# Patient Record
Sex: Female | Born: 1965 | Race: White | Hispanic: No | Marital: Married | Smoking: Never smoker
Health system: Southern US, Community
[De-identification: ages and names within clinical notes are randomized; demographics above are authoritative.]

## PROBLEM LIST (undated history)

## (undated) DIAGNOSIS — Z87892 Personal history of anaphylaxis: Secondary | ICD-10-CM

## (undated) DIAGNOSIS — M549 Dorsalgia, unspecified: Secondary | ICD-10-CM

## (undated) DIAGNOSIS — N83202 Unspecified ovarian cyst, left side: Secondary | ICD-10-CM

## (undated) DIAGNOSIS — G43909 Migraine, unspecified, not intractable, without status migrainosus: Secondary | ICD-10-CM

## (undated) DIAGNOSIS — G8929 Other chronic pain: Secondary | ICD-10-CM

## (undated) DIAGNOSIS — F32A Depression, unspecified: Secondary | ICD-10-CM

## (undated) DIAGNOSIS — E669 Obesity, unspecified: Secondary | ICD-10-CM

## (undated) DIAGNOSIS — L720 Epidermal cyst: Secondary | ICD-10-CM

## (undated) DIAGNOSIS — F419 Anxiety disorder, unspecified: Secondary | ICD-10-CM

## (undated) DIAGNOSIS — F329 Major depressive disorder, single episode, unspecified: Secondary | ICD-10-CM

## (undated) DIAGNOSIS — J45909 Unspecified asthma, uncomplicated: Secondary | ICD-10-CM

## (undated) HISTORY — DX: Major depressive disorder, single episode, unspecified: F32.9

## (undated) HISTORY — DX: Unspecified asthma, uncomplicated: J45.909

## (undated) HISTORY — DX: Other chronic pain: G89.29

## (undated) HISTORY — DX: Dorsalgia, unspecified: M54.9

## (undated) HISTORY — DX: Migraine, unspecified, not intractable, without status migrainosus: G43.909

## (undated) HISTORY — PX: BACK SURGERY: SHX140

## (undated) HISTORY — DX: Depression, unspecified: F32.A

## (undated) HISTORY — DX: Epidermal cyst: L72.0

## (undated) HISTORY — DX: Obesity, unspecified: E66.9

## (undated) HISTORY — PX: FOOT SURGERY: SHX648

## (undated) HISTORY — DX: Anxiety disorder, unspecified: F41.9

## (undated) HISTORY — DX: Unspecified ovarian cyst, left side: N83.202

## (undated) HISTORY — DX: Personal history of anaphylaxis: Z87.892

---

## 2015-04-29 LAB — HM MAMMOGRAPHY

## 2016-05-14 ENCOUNTER — Ambulatory Visit (INDEPENDENT_AMBULATORY_CARE_PROVIDER_SITE_OTHER): Payer: BLUE CROSS/BLUE SHIELD | Admitting: Physician Assistant

## 2016-05-14 ENCOUNTER — Other Ambulatory Visit: Payer: Self-pay

## 2016-05-14 ENCOUNTER — Encounter: Payer: Self-pay | Admitting: Physician Assistant

## 2016-05-14 VITALS — BP 132/88 | HR 76 | Ht 63.0 in | Wt 236.0 lb

## 2016-05-14 DIAGNOSIS — F331 Major depressive disorder, recurrent, moderate: Secondary | ICD-10-CM | POA: Diagnosis not present

## 2016-05-14 DIAGNOSIS — N921 Excessive and frequent menstruation with irregular cycle: Secondary | ICD-10-CM | POA: Insufficient documentation

## 2016-05-14 DIAGNOSIS — IMO0002 Reserved for concepts with insufficient information to code with codable children: Secondary | ICD-10-CM | POA: Insufficient documentation

## 2016-05-14 DIAGNOSIS — L720 Epidermal cyst: Secondary | ICD-10-CM

## 2016-05-14 DIAGNOSIS — Z0000 Encounter for general adult medical examination without abnormal findings: Secondary | ICD-10-CM | POA: Diagnosis not present

## 2016-05-14 DIAGNOSIS — Z23 Encounter for immunization: Secondary | ICD-10-CM | POA: Diagnosis not present

## 2016-05-14 DIAGNOSIS — G43709 Chronic migraine without aura, not intractable, without status migrainosus: Secondary | ICD-10-CM | POA: Insufficient documentation

## 2016-05-14 DIAGNOSIS — G43109 Migraine with aura, not intractable, without status migrainosus: Secondary | ICD-10-CM

## 2016-05-14 HISTORY — DX: Epidermal cyst: L72.0

## 2016-05-14 MED ORDER — TOPIRAMATE ER 50 MG PO CAP24: 50.0000 mg | ORAL_CAPSULE | Freq: Every day | ORAL | 11 refills | Status: DC

## 2016-05-14 MED ORDER — BUPROPION HCL ER (XL) 150 MG PO TB24: ORAL_TABLET | ORAL | 3 refills | Status: DC

## 2016-05-14 NOTE — Patient Instructions (Addendum)
Go downstairs for labs. We will contact you within 48 hours with your results. Set up MyChart to receive your results even sooner.  Migraines/Depression - stop Topamax - start Trokendi 50mg  nightly - activate the copay card - start Wellbutrin 150mg  every morning - follow-up in 1 month  Cyst on your chest wall: - if you want to have it excised, make an appointment   You are due for colon cancer screening - let us know if you decide to do Cologuard or would like a referral for colonoscopy  I have ordered your annual mammogram - you will be contacted by our imaging department to schedule this [facility] in the [facility]  Physical Activity Recommendations for modifying lipids and lowering blood pressure Engage in aerobic physical activity to reduce LDL-cholesterol, non-HDL-cholesterol, and blood pressure  Frequency: 3-4 sessions per week  Intensity: moderate to vigorous  Duration: 40 minutes on average  Physical Activity Recommendations for secondary prevention 1. Aerobic exercise  Frequency: 3-5 sessions per week  Intensity: 50-80% capacity  Duration: 20 - 60 minutes  Examples: walking, treadmill, cycling, rowing, stair climbing, and arm/leg ergometry  2. Resistance exercise  Frequency: 2-3 sessions per week  Intensity: 10-15 repetitions/set to moderate fatigue  Duration: 1-3 sets of 8-10 upper and lower body exercises  Examples: calisthenics, elastic bands, cuff/hand weights, dumbbels, free weights, wall pulleys, and weight machines  Heart-Healthy Lifestyle  Eating a diet rich in vegetables, fruits and whole grains: also includes low-fat dairy products, poultry, fish, legumes, and nuts; limit intake of sweets, sugar-sweetened beverages and red meats  Getting regular exercise  Maintaining a healthy weight  Not smoking or getting help quitting  Staying on top of your health; for some people, lifestyle changes alone may not be enough to prevent a heart attack or  stroke. In these cases, taking a statin at the right dose will most likely be necessary

## 2016-05-14 NOTE — Progress Notes (Signed)
HPI:                                                                Cathy Oconnell is a 51 y.o. female who presents to [facility]: Primary Care Sports Medicine today to establish care   Current Concerns include right breast lump  Patient reports a firm, nontender lump on her right chest wall/breast area that has been present for approximately 1 year. She thinks it may have increased in size. Denies any drainage or skin changes to the area. She states it was present during her screening mammogram last year and she was told that it was not concerning. Her screening mammo on 04/29/15 was Bi-RADS 2-benign.   Depression: patient states this is a chronic problem for her dating back to childhood and issues with her parents. She states she has been on Wellbutrin in the past and this worked well for her. She endorses occasional suicidal thoughts, but does not have a plan and states she would not act on them. She denies symptoms of mania/hypomania. Denies AH/VH.   Chronic Back Pain: taking narcotic pain medication. Followed by [facility].   Health Maintenance Health Maintenance  Topic Date Due  . HIV Screening  02/27/1981  . COLONOSCOPY  02/28/2016  . INFLUENZA VACCINE  10/25/2018 (Originally 09/24/2015)  . MAMMOGRAM  04/28/2017  . PAP SMEAR  04/01/2018  . TETANUS/TDAP  05/15/2026    GYN/Sexual Health  Menstrual status: perimenopausal  LMP: 12/2015  Menses: irregular, heavy  Last pap smear: 03/2015  History of abnormal pap smears: no  Sexually active: yes, 1 female partner (husband)  Current contraception: none  Health Habits  Diet: fair, 3 cups coffee/tea/soda per day  Exercise: yoga 45 min x 4 days/week  Past Medical History:  Diagnosis Date  . Anxiety   . Asthma   . Chronic back pain   . Depression   . Migraine headache    Past Surgical History:  Procedure Laterality Date  . BACK SURGERY    . CESAREAN SECTION    . FOOT  SURGERY Left    Social History  Substance Use Topics  . Smoking status: Never Smoker  . Smokeless tobacco: Never Used  . Alcohol use 0.6 oz/week    1 Glasses of wine per week   family history includes Breast cancer in her mother.  ROS: negative except as noted in the HPI  Medications: Current Outpatient Prescriptions  Medication Sig Dispense Refill  . buPROPion (WELLBUTRIN XL) 150 MG 24 hr tablet 1 tablet daily for a month then 1 tab twice a day 60 tablet 3  . HYDROmorphone (DILAUDID) 4 MG tablet     . HYDROmorphone HCl (EXALGO) 8 MG T24A SR tablet     . Topiramate ER (TROKENDI XR) 50 MG CP24 Take 50 mg by mouth at bedtime. 30 capsule 11   No current facility-administered medications for this visit.    Allergies  Allergen Reactions  . Aspirin Anaphylaxis  . Albuterol        Objective:  BP 132/88   Pulse 76   Ht 5\' 3"  (1.6 m)   Wt 236 lb (107 kg)   BMI 41.81 kg/m  Gen: well-groomed, obese, cooperative, not ill-appearing, no distress HEENT: normal  conjunctiva, TM's clear, oropharynx clear, moist mucus membranes, no thyromegaly or tenderness Pulm: Normal work of breathing, normal phonation, clear to auscultation bilaterally CV: Normal rate, regular rhythm, s1 and s2 distinct, no murmurs, clicks or rubs, no carotid bruit GI: abdomen soft, nondistended, nontender, no masses Neuro: alert and oriented x 3, EOM's intact, PERRLA, DTR's intact, normal tone, no tremor MSK: moving all extremities, normal gait and station, no peripheral edema Skin: warm and dry; firm approx. 0.5 cm nodule on the right chest wall, borders well-defined, nontender Psych: normal affect, euthymic mood, normal speech and thought content  [facility] 05/14/2016 05/14/2016  Decreased Interest 1 1  Down, Depressed, Hopeless 2 2  PHQ - 2 Score 3 3  Altered sleeping 2 2  Tired, decreased energy 2 2  Change in appetite 1 1  Feeling bad or failure about yourself  2 2  Trouble concentrating  0 0  Moving slowly or fidgety/restless 1 1  Suicidal thoughts 1 1  PHQ-9 Score 12 12      Assessment and Plan: 51 y.o. female with   Encounter for preventative adult health care examination - CBC - Comprehensive metabolic panel - Hemoglobin A1c - Lipid Panel w/reflex Direct LDL - Pap utd - Mammogram ordered - Tdap vaccine given - discussed Cologuard for colon cancer screening. Patient is going to contact her insurance company and let Cathy Oconnell know at her follow-up appt in 4 weeks  Moderate episode of recurrent major depressive disorder (HCC) - patient contracted for safety - restarting wellbutrin - buPROPion (WELLBUTRIN XL) 150 MG 24 hr tablet; 1 tablet daily for a month then 1 tab twice a day  Dispense: 60 tablet; Refill: 3 - follow-up in 4 weeks  Migraine with aura and without status migrainosus, not intractable - switching from Topamax to Trokendi for better migraine prevention - Topiramate ER (TROKENDI XR) 50 MG CP24; Take 50 mg by mouth at bedtime.  Dispense: 30 capsule; Refill: 11  Epidermoid cyst of skin of chest - reassurance provided - patient instructed that if she desires excision she can schedule this in our office. Discussed there is a high probability of recurrence  Patient education and anticipatory guidance given Patient agrees with treatment plan Follow-up in 4 weeks or sooner as needed  Levonne Hubertharley E. Houa Ackert PA-C

## 2016-06-11 ENCOUNTER — Ambulatory Visit (INDEPENDENT_AMBULATORY_CARE_PROVIDER_SITE_OTHER): Payer: BLUE CROSS/BLUE SHIELD | Admitting: Physician Assistant

## 2016-06-11 VITALS — BP 138/81 | HR 80 | Wt 240.0 lb

## 2016-06-11 DIAGNOSIS — F331 Major depressive disorder, recurrent, moderate: Secondary | ICD-10-CM

## 2016-06-11 DIAGNOSIS — G43109 Migraine with aura, not intractable, without status migrainosus: Secondary | ICD-10-CM | POA: Diagnosis not present

## 2016-06-11 MED ORDER — FLUOXETINE HCL 20 MG PO TABS: 20.0000 mg | ORAL_TABLET | Freq: Every day | ORAL | 3 refills | Status: DC

## 2016-06-11 MED ORDER — TOPIRAMATE ER 100 MG PO CAP24: 100.0000 mg | ORAL_CAPSULE | Freq: Every day | ORAL | 11 refills | Status: DC

## 2016-06-11 MED ORDER — RIZATRIPTAN BENZOATE 5 MG PO TBDP: 5.0000 mg | ORAL_TABLET | ORAL | 5 refills | Status: DC | PRN

## 2016-06-11 NOTE — Patient Instructions (Addendum)
- Take Fluoxetine 1/2 tablet daily for a week, then take a full tablet daily - Okay to take Fluoxetine with your Wellbutrin - Switch to Trokendi  nightly - Exercise at least 25 minutes 3-4 days per week   Living With Depression Everyone experiences occasional disappointment, sadness, and loss in their lives. When you are feeling down, blue, or sad for at least 2 weeks in a row, it may mean that you have depression. Depression can affect your thoughts and feelings, relationships, daily activities, and physical health. It is caused by changes in the way your brain functions. If you receive a diagnosis of depression, your health care provider will tell you which type of depression you have and what treatment options are available to you. If you are living with depression, there are ways to help you recover from it and also ways to prevent it from coming back. How to cope with lifestyle changes Coping with stress  Stress is your body's reaction to life changes and events, both good and bad. Stressful situations may include:  Getting married.  The death of a spouse.  Losing a job.  Retiring.  Having a baby. Stress can last just a few hours or it can be ongoing. Stress can play a major role in depression, so it is important to learn both how to cope with stress and how to think about it differently. Talk with your health care provider or a counselor if you would like to learn more about stress reduction. He or she may suggest some stress reduction techniques, such as:  Music therapy. This can include creating music or listening to music. Choose music that you enjoy and that inspires you.  Mindfulness-based meditation. This kind of meditation can be done while sitting or walking. It involves being aware of your normal breaths, rather than trying to control your breathing.  Centering prayer. This is a kind of meditation that involves focusing on a spiritual word or phrase. Choose a word,  phrase, or sacred image that is meaningful to you and that brings you peace.  Deep breathing. To do this, expand your stomach and inhale slowly through your nose. Hold your breath for 3-5 seconds, then exhale slowly, allowing your stomach muscles to relax.  Muscle relaxation. This involves intentionally tensing muscles then relaxing them. Choose a stress reduction technique that fits your lifestyle and personality. Stress reduction techniques take time and practice to develop. Set aside 5-15 minutes a day to do them. Therapists can offer training in these techniques. The training may be covered by some insurance plans. Other things you can do to manage stress include:  Keeping a stress diary. This can help you learn what triggers your stress and ways to control your response.  Understanding what your limits are and saying no to requests or events that lead to a schedule that is too full.  Thinking about how you respond to certain situations. You may not be able to control everything, but you can control how you react.  Adding humor to your life by watching funny films or TV shows.  Making time for activities that help you relax and not feeling guilty about spending your time this way. Medicines  Your health care provider may suggest certain medicines if he or she feels that they will help improve your condition. Avoid using alcohol and other substances that may prevent your medicines from working properly (may interact). It is also important to:  Talk with your pharmacist or health care provider  about all the medicines that you take, their possible side effects, and what medicines are safe to take together.  Make it your goal to take part in all treatment decisions (shared decision-making). This includes giving input on the side effects of medicines. It is best if shared decision-making with your health care provider is part of your total treatment plan. If your health care provider prescribes  a medicine, you may not notice the full benefits of it for 4-8 weeks. Most people who are treated for depression need to be on medicine for at least 6-12 months after they feel better. If you are taking medicines as part of your treatment, do not stop taking medicines without first talking to your health care provider. You may need to have the medicine slowly decreased (tapered) over time to decrease the risk of harmful side effects. Relationships  Your health care provider may suggest family therapy along with individual therapy and drug therapy. While there may not be family problems that are causing you to feel depressed, it is still important to make sure your family learns as much as they can about your mental health. Having your family's support can help make your treatment successful. How to recognize changes in your condition Everyone has a different response to treatment for depression. Recovery from major depression happens when you have not had signs of major depression for two months. This may mean that you will start to:  Have more interest in doing activities.  Feel less hopeless than you did 2 months ago.  Have more energy.  Overeat less often, or have better or improving appetite.  Have better concentration. Your health care provider will work with you to decide the next steps in your recovery. It is also important to recognize when your condition is getting worse. Watch for these signs:  Having fatigue or low energy.  Eating too much or too little.  Sleeping too much or too little.  Feeling restless, agitated, or hopeless.  Having trouble concentrating or making decisions.  Having unexplained physical complaints.  Feeling irritable, angry, or aggressive. Get help as soon as you or your family members notice these symptoms coming back. How to get support and help from others How to talk with friends and family members about your condition  Talking to friends and  family members about your condition can provide you with one way to get support and guidance. Reach out to trusted friends or family members, explain your symptoms to them, and let them know that you are working with a health care provider to treat your depression. Financial resources  Not all insurance plans cover mental health care, so it is important to check with your insurance carrier. If paying for co-pays or counseling services is a problem, search for a local or county mental health care center. They may be able to offer public mental health care services at low or no cost when you are not able to see a private health care provider. If you are taking medicine for depression, you may be able to get the generic form, which may be less expensive. Some makers of prescription medicines also offer help to patients who cannot afford the medicines they need. Follow these instructions at home:  Get the right amount and quality of sleep.  Cut down on using caffeine, tobacco, alcohol, and other potentially harmful substances.  Try to exercise, such as walking or lifting small weights.  Take over-the-counter and prescription medicines only as told by your  health care provider.  Eat a healthy diet that includes plenty of vegetables, fruits, whole grains, low-fat dairy products, and lean protein. Do not eat a lot of foods that are high in solid fats, added sugars, or salt.  Keep all follow-up visits as told by your health care provider. This is important. Contact a health care provider if:  You stop taking your antidepressant medicines, and you have any of these symptoms:  Nausea.  Headache.  Feeling lightheaded.  Chills and body aches.  Not being able to sleep (insomnia).  You or your friends and family think your depression is getting worse. Get help right away if:  You have thoughts of hurting yourself or others. If you ever feel like you may hurt yourself or others, or have thoughts  about taking your own life, get help right away. You can go to your nearest emergency department or call:  Your local emergency services (911 in the [place]  A suicide crisis helpline, such as the National Suicide Prevention Lifeline at 306-121-9415. This is open 24-hours a day. Summary  If you are living with depression, there are ways to help you recover from it and also ways to prevent it from coming back.  Work with your health care team to create a management plan that includes counseling, stress management techniques, and healthy lifestyle habits. This information is not intended to replace advice given to you by your health care provider. Make sure you discuss any questions you have with your health care provider. Document Released: 01/13/2016 Document Revised: 01/13/2016 Document Reviewed: 01/13/2016 Elsevier Interactive Patient Education  2017 [employer].

## 2016-06-11 NOTE — Progress Notes (Signed)
HPI:                                                                Cathy Oconnell is a 51 y.o. female who presents to [name]: Primary Care Sports Medicine today for depression and headache follow-up  Migraines: patient has been taking Trokendi  nightly without issues. Patient reports 5 migraines since last visit on 05/14/16. Previously she was having approximately 8 migraines per month on Topamax, so this does represent an improvement in number of headache days for her.   Depression: taking Wellbutrin without difficulty. She continues to endorse feelings of worthlessness and tearfulness. She has occasional suicidal thoughts, but she does not have a plan and states she would never hurt herself. She has no history of self-harm. Denies symptoms of mania/hypomania. Denies auditory/visual hallucinations.   Past Medical History:  Diagnosis Date  . Anxiety   . Asthma   . Chronic back pain   . Depression   . Migraine headache    Past Surgical History:  Procedure Laterality Date  . BACK SURGERY    . CESAREAN SECTION    . FOOT SURGERY Left    Social History  Substance Use Topics  . Smoking status: Never Smoker  . Smokeless tobacco: Never Used  . Alcohol use 0.6 oz/week    1 Glasses of wine per week   family history includes Breast cancer in her mother.  ROS: negative except as noted in the HPI  Medications: Current Outpatient Prescriptions  Medication Sig Dispense Refill  . buPROPion (WELLBUTRIN XL) 150 MG 24 hr tablet 1 tablet daily for a month then 1 tab twice a day 60 tablet 3  . HYDROmorphone (DILAUDID) 4 MG tablet     . HYDROmorphone HCl (EXALGO) 8 MG T24A SR tablet     . Topiramate ER (TROKENDI XR) 50 MG CP24 Take 50 mg by mouth at bedtime. 30 capsule 11   No current facility-administered medications for this visit.    Allergies  Allergen Reactions  . Aspirin Anaphylaxis  . Albuterol        Objective:  BP 138/81   Pulse 80   Wt  240 lb (108.9 kg)   BMI 42.51 kg/m  Gen: well-groomed, cooperative, not ill-appearing, no distress Pulm: Normal work of breathing, normal phonation Neuro: alert and oriented x 3, EOM's intact, no tremor MSK: moving all extremities, normal gait and station, no peripheral edema Psych: good eye contact, depressed mood, becomes tearful during visit, normal speech and thought content   Depression screen [facility] 2/9 06/11/2016 05/14/2016 05/14/2016  Decreased Interest Down, Depressed, Hopeless PHQ - 2 Score Altered sleeping Tired, decreased energy Change in appetite Feeling bad or failure about yourself  Trouble concentrating 1 0 0  Moving slowly or fidgety/restless Suicidal thoughts PHQ-9 Score Assessment and Plan: 51 y.o. female with   1. Migraine with aura and without status migrainosus, not intractable - increasing Trokendi to  nightly - Topiramate ER (TROKENDI XR) 100 MG CP24; Take 100 mg by  mouth at bedtime.  Dispense: 30 capsule; Refill: 11 - rizatriptan (MAXALT-MLT) 5 MG disintegrating tablet; Take 1 tablet (5 mg total) by mouth as needed for migraine.  Dispense: 10 tablet; Refill: 5  2. Moderate episode of recurrent major depressive disorder (HCC) - PHQ9 increased from 12 to 14 despite a month of Wellbutrin - patient contracted for safety - will leave Wellbutrin at  to avoid risk of increasing headaches - adding Fluoxetine  - encouraged regular exercise - FLUoxetine (PROZAC) 20 MG tablet; Take 1 tablet (20 mg total) by mouth daily.  Dispense: 30 tablet; Refill: 3  Patient education and anticipatory guidance given Patient agrees with treatment plan Follow-up in 4 weeks or sooner as needed if symptoms worsen or fail to improve  Levonne Hubert PA-C

## 2016-06-15 ENCOUNTER — Encounter: Payer: Self-pay | Admitting: Physician Assistant

## 2016-06-16 ENCOUNTER — Ambulatory Visit: Payer: BLUE CROSS/BLUE SHIELD

## 2016-06-22 ENCOUNTER — Ambulatory Visit (INDEPENDENT_AMBULATORY_CARE_PROVIDER_SITE_OTHER): Payer: BLUE CROSS/BLUE SHIELD | Admitting: Physician Assistant

## 2016-06-22 ENCOUNTER — Encounter: Payer: Self-pay | Admitting: Physician Assistant

## 2016-06-22 VITALS — BP 139/93 | HR 85 | Wt 240.0 lb

## 2016-06-22 DIAGNOSIS — L720 Epidermal cyst: Secondary | ICD-10-CM

## 2016-06-22 DIAGNOSIS — L02213 Cutaneous abscess of chest wall: Secondary | ICD-10-CM | POA: Diagnosis not present

## 2016-06-22 MED ORDER — ACETAMINOPHEN 325 MG PO TABS
1000.0000 mg | ORAL_TABLET | Freq: Once | ORAL | Status: AC
  Administered 2016-06-22: 975 mg via ORAL

## 2016-06-22 MED ORDER — DOXYCYCLINE HYCLATE 100 MG PO TABS: 100.0000 mg | ORAL_TABLET | Freq: Two times a day (BID) | ORAL | 0 refills | Status: DC

## 2016-06-22 NOTE — Progress Notes (Signed)
   Subjective:    I'm seeing this patient as a consultation for:  Gena Fray, PA-C  CC: Mass on chest  HPI: For a long time this pleasant 51 year old female has had a occasionally tender mass on her right upper chest. Charley started incision and drainage and got some purulence out, because persistent sebaceous material was seen coming from the wound I was called for further evaluation and definitive treatment. Symptoms are moderate, persistent, localized without radiation.  Past medical history:  Negative.  See flowsheet/record as well for more information.  Surgical history: Negative.  See flowsheet/record as well for more information.  Family history: Negative.  See flowsheet/record as well for more information.  Social history: Negative.  See flowsheet/record as well for more information.  Allergies, and medications have been entered into the medical record, reviewed, and no changes needed.   Review of Systems: No headache, visual changes, nausea, vomiting, diarrhea, constipation, dizziness, abdominal pain, skin rash, fevers, chills, night sweats, weight loss, swollen lymph nodes, body aches, joint swelling, muscle aches, chest pain, shortness of breath, mood changes, visual or auditory hallucinations.   Objective:   General: Well Developed, well nourished, and in no acute distress.  Neuro/Psych: Alert and oriented x3, extra-ocular muscles intact, able to move all 4 extremities, sensation grossly intact. Skin: Warm and dry, no rashes noted. There is a 1 cm incision over the right upper anterior chest, there is a minimal surrounding erythema. Pressure does express some sebaceous material. Respiratory: Not using accessory muscles, speaking in full sentences, trachea midline.  Cardiovascular: Pulses palpable, no extremity edema. Abdomen: Does not appear distended.  Excision of skin mass, 1 cm sebaceous cyst. Risks, benefits, and alternatives explained and consent obtained. Time out  conducted. Surface cleaned with chlorhexidine. Area was already anesthetized with lidocaine and epinephrine Adequate anesthesia ensured. Area prepped and draped in a sterile fashion. I extended the length of the incision with a #11 blade. Then using curved hemostat I explored the 4 quadrants finding the wall of the sebaceous cyst which I was able to remove with additional pressure. Hemostasis achieved. Pt stable. Aftercare and follow-up advised.  Impression and Recommendations:   This case required medical decision making of moderate complexity.  Epidermoid cyst of skin of chest Additional surgical excision as above, antibiotic choice and aftercare per Gena Fray PA-C.

## 2016-06-22 NOTE — Assessment & Plan Note (Signed)
Additional surgical excision as above, antibiotic choice and aftercare per Gena Fray PA-C.

## 2016-06-22 NOTE — Progress Notes (Signed)
HPI:                                                                Cathy Oconnell is a 51 y.o. female who presents to [name]: Primary Care Sports Medicine today for "knot on chest"  Rash  This is a new problem. The current episode started in the past 7 days. The problem has been gradually worsening since onset. The affected locations include the chest. The rash is characterized by redness, swelling and pain. She was exposed to nothing (history of cutaneous cyst). Past treatments include nothing.     Past Medical History:  Diagnosis Date  . Anxiety   . Asthma   . Chronic back pain   . Depression   . Migraine headache   . Obesity    Past Surgical History:  Procedure Laterality Date  . BACK SURGERY    . CESAREAN SECTION    . FOOT SURGERY Left    Social History  Substance Use Topics  . Smoking status: Never Smoker  . Smokeless tobacco: Never Used  . Alcohol use 0.6 oz/week    1 Glasses of wine per week   family history includes Breast cancer in her mother.  ROS: negative except as noted in the HPI  Medications: Current Outpatient Prescriptions  Medication Sig Dispense Refill  . buPROPion (WELLBUTRIN XL) 150 MG 24 hr tablet 1 tablet daily for a month then 1 tab twice a day 60 tablet 3  . doxycycline (VIBRA-TABS) 100 MG tablet Take 1 tablet (100 mg total) by mouth 2 (two) times daily. 14 tablet 0  . FLUoxetine (PROZAC) 20 MG tablet Take 1 tablet (20 mg total) by mouth daily. 30 tablet 3  . HYDROmorphone (DILAUDID) 4 MG tablet     . HYDROmorphone HCl (EXALGO) 8 MG T24A SR tablet     . rizatriptan (MAXALT-MLT) 5 MG disintegrating tablet Take 1 tablet (5 mg total) by mouth as needed for migraine. 10 tablet 5  . Topiramate ER (TROKENDI XR) 100 MG CP24 Take 100 mg by mouth at bedtime. 30 capsule 11   No current facility-administered medications for this visit.    Allergies  Allergen Reactions  . Aspirin Anaphylaxis  . Albuterol         Objective:  BP (!) 139/93   Pulse 85   Wt 240 lb (108.9 kg)   BMI 42.51 kg/m  Gen: well-groomed, cooperative, not ill-appearing, no distress Pulm: Normal work of breathing, normal phonation Neuro: alert and oriented x 3, no tremor MSK: moving all extremities, normal gait and station, no peripheral edema Psych: good eye contact, normal affect, euthymic mood, normal speech and thought content Skin: warm and dry, approx 1.5 cm fluctuant abscess on the right chest wall in the third intercostal area   No results found for this or any previous visit (from the past 72 hour(s)). No results found.    Assessment and Plan: 51 y.o. female with   1. Epidermoid cyst of skin of chest - consulted Dr. Benjamin Stain (see A&P note)  2. Cutaneous abscess of chest wall Procedure:  Incision and drainage of abscess. Risks, benefits, and alternatives explained and consent obtained. Time out conducted. Surface cleaned with chlorhexidine 3 cc lidocaine with epinephine infiltrated around abscess.  Adequate anesthesia ensured. Area prepped and draped in a sterile fashion. #11 blade used to make a stab incision into abscess. Pus expressed with pressure. Curved hemostat used to explore 4 quadrants and loculations broken up. Further purulence expressed. Hemostasis achieved. Patient tolerated procedure without immediate complications. - doxycycline (VIBRA-TABS) 100 MG tablet; Take 1 tablet (100 mg total) by mouth 2 (two) times daily.  Dispense: 14 tablet; Refill: 0   Patient education and anticipatory guidance given Patient agrees with treatment plan Follow-up in 1 week or sooner as needed if symptoms worsen or fail to improve  Levonne Hubert PA-C

## 2016-06-22 NOTE — Patient Instructions (Addendum)
- Take antibiotic twice a day for 7 days - Keep area covered with dressing for 24-48 areas - Keep area clean and dry    Skin Abscess A skin abscess is an infected area on or under your skin that contains a collection of pus and other material. An abscess may also be called a furuncle, carbuncle, or boil. An abscess can occur in or on almost any part of your body. Some abscesses break open (rupture) on their own. Most continue to get worse unless they are treated. The infection can spread deeper into the body and eventually into your blood, which can make you feel ill. Treatment usually involves draining the abscess. What are the causes? An abscess occurs when germs, often bacteria, pass through your skin and cause an infection. This may be caused by:  A scrape or cut on your skin.  A puncture wound through your skin, including a needle injection.  Blocked oil or sweat glands.  Blocked and infected hair follicles.  A cyst that forms beneath your skin (sebaceous cyst) and becomes infected. What increases the risk? This condition is more likely to develop in people who:  Have a weak body defense system (immune system).  Have diabetes.  Have dry and irritated skin.  Get frequent injections or use illegal IV drugs.  Have a foreign body in a wound, such as a splinter.  Have problems with their lymph system or veins. What are the signs or symptoms? An abscess may start as a painful, firm bump under the skin. Over time, the abscess may get larger or become softer. Pus may appear at the top of the abscess, causing pressure and pain. It may eventually break through the skin and drain. Other symptoms include:  Redness.  Warmth.  Swelling.  Tenderness.  A sore on the skin. How is this diagnosed? This condition is diagnosed based on your medical history and a physical exam. A sample of pus may be taken from the abscess to find out what is causing the infection and what antibiotics  can be used to treat it. You also may have:  Blood tests to look for signs of infection or spread of an infection to your blood.  Imaging studies such as ultrasound, CT scan, or MRI if the abscess is deep. How is this treated? Small abscesses that drain on their own may not need treatment. Treatment for an abscess that does not rupture on its own may include:  Warm compresses applied to the area several times per day.  Incision and drainage. Your health care provider will make an incision to open the abscess and will remove pus and any foreign body or dead tissue. The incision area may be packed with gauze to keep it open for a few days while it heals.  Antibiotic medicines to treat infection. For a severe abscess, you may first get antibiotics through an IV and then change to oral antibiotics. Follow these instructions at home: Abscess Care   If you have an abscess that has not drained, place a warm, clean, wet washcloth over the abscess several times a day. Do this as told by your health care provider.  Follow instructions from your health care provider about how to take care of your abscess. Make sure you:  Cover the abscess with a bandage (dressing).  Change your dressing or gauze as told by your health care provider.  Wash your hands with soap and water before you change the dressing or gauze. If soap  and water are not available, use hand sanitizer.  Check your abscess every day for signs of a worsening infection. Check for:  More redness, swelling, or pain.  More fluid or blood.  Warmth.  More pus or a bad smell. Medicines   Take over-the-counter and prescription medicines only as told by your health care provider.  If you were prescribed an antibiotic medicine, take it as told by your health care provider. Do not stop taking the antibiotic even if you start to feel better. General instructions   To avoid spreading the infection:  Do not share personal care items,  towels, or hot tubs with others.  Avoid making skin contact with other people.  Keep all follow-up visits as told by your health care provider. This is important. Contact a health care provider if:  You have more redness, swelling, or pain around your abscess.  You have more fluid or blood coming from your abscess.  Your abscess feels warm to the touch.  You have more pus or a bad smell coming from your abscess.  You have a fever.  You have muscle aches.  You have chills or a general ill feeling. Get help right away if:  You have severe pain.  You see red streaks on your skin spreading away from the abscess. This information is not intended to replace advice given to you by your health care provider. Make sure you discuss any questions you have with your health care provider. Document Released: 11/19/2004 Document Revised: 10/06/2015 Document Reviewed: 12/19/2014 Elsevier Interactive Patient Education  2017 [employer].

## 2016-06-29 ENCOUNTER — Ambulatory Visit (INDEPENDENT_AMBULATORY_CARE_PROVIDER_SITE_OTHER): Payer: BLUE CROSS/BLUE SHIELD | Admitting: Physician Assistant

## 2016-06-29 VITALS — BP 136/83 | HR 79 | Wt 240.0 lb

## 2016-06-29 DIAGNOSIS — L02213 Cutaneous abscess of chest wall: Secondary | ICD-10-CM | POA: Diagnosis not present

## 2016-06-29 NOTE — Progress Notes (Signed)
HPI:                                                                Cathy Oconnell is a 51 y.o. female who [facility]: Primary Care Sports Medicine today for follow-up of a chest wall abscess  Patient reports no issues since incision and drainage of right-sided chest wall abscess 2/2 infected epidermoid cyst on 06/22/16. Cyst was also excised by Dr. Benjamin Stainhekkekandam at that time. She has been taking Doxycyline without issue. She denies any pain, swelling, drainage or warmth at the incision site. She denies any fevers or chills.  Past Medical History:  Diagnosis Date  . Anxiety   . Asthma   . Chronic back pain   . Depression   . Epidermoid cyst of skin of chest 05/14/2016  . Migraine headache   . Obesity    Past Surgical History:  Procedure Laterality Date  . BACK SURGERY    . CESAREAN SECTION    . FOOT SURGERY Left    Social History  Substance Use Topics  . Smoking status: Never Smoker  . Smokeless tobacco: Never Used  . Alcohol use 0.6 oz/week    1 Glasses of wine per week   family history includes Breast cancer in her mother.  ROS: negative except as noted in the HPI  Medications: Current Outpatient Prescriptions  Medication Sig Dispense Refill  . albuterol (PROVENTIL HFA;VENTOLIN HFA) 108 (90 Base) MCG/ACT inhaler Inhale into the lungs every 6 (six) hours as needed.    Marland Kitchen. buPROPion (WELLBUTRIN XL) 150 MG 24 hr tablet 1 tablet daily for a month then 1 tab twice a day 60 tablet 3  . FLUoxetine (PROZAC) 20 MG tablet Take 1 tablet (20 mg total) by mouth daily. 30 tablet 3  . HYDROmorphone (DILAUDID) 4 MG tablet     . HYDROmorphone HCl (EXALGO) 8 MG T24A SR tablet     . rizatriptan (MAXALT-MLT) 5 MG disintegrating tablet Take 1 tablet (5 mg total) by mouth as needed for migraine. 10 tablet 5  . Topiramate ER (TROKENDI XR) 100 MG CP24 Take 100 mg by mouth at bedtime. 30 capsule 11   No current facility-administered medications for this visit.     Allergies  Allergen Reactions  . Aspirin Anaphylaxis       Objective:  BP 136/83   Pulse 79   Wt 240 lb (108.9 kg)   BMI 42.51 kg/m  Gen: well-groomed, cooperative, not ill-appearing, no distress Pulm: Normal work of breathing, normal phonation Neuro: alert and oriented x 3,  MSK: moving all extremities, normal gait and station, no peripheral edema Skin: right chest wall with approx. 1.0 cm healing surgical incision, there is approx. 2.0 cm of surrounding non-tender induration and erythema, no warmth, fluctuance, edema or drainage   No results found for this or any previous visit (from the past 72 hour(s)). No results found.    Assessment and Plan: 51 y.o. female with   1. Cutaneous abscess of chest wall, improving - complete Doxycycline - return if worsening redness, swelling or pain  Patient education and anticipatory guidance given Patient agrees with treatment plan Follow-up as needed if symptoms worsen or fail to improve  Levonne Hubertharley E. Cummings PA-C

## 2016-06-30 ENCOUNTER — Encounter: Payer: Self-pay | Admitting: Physician Assistant

## 2016-07-09 ENCOUNTER — Ambulatory Visit (INDEPENDENT_AMBULATORY_CARE_PROVIDER_SITE_OTHER): Payer: BLUE CROSS/BLUE SHIELD | Admitting: Physician Assistant

## 2016-07-09 VITALS — BP 95/63 | HR 84 | Wt 241.0 lb

## 2016-07-09 DIAGNOSIS — F5105 Insomnia due to other mental disorder: Secondary | ICD-10-CM

## 2016-07-09 DIAGNOSIS — F331 Major depressive disorder, recurrent, moderate: Secondary | ICD-10-CM | POA: Diagnosis not present

## 2016-07-09 DIAGNOSIS — F99 Mental disorder, not otherwise specified: Secondary | ICD-10-CM

## 2016-07-09 MED ORDER — TRAZODONE HCL 50 MG PO TABS: 25.0000 mg | ORAL_TABLET | Freq: Every evening | ORAL | 3 refills | Status: DC | PRN

## 2016-07-09 NOTE — Progress Notes (Signed)
HPI:                                                                Cathy Oconnell is a 51 y.o. female who [facility] [facility] today for depression follow-up  Depression/Anxiety: taking Fluoxetine and Wellbutrin without difficulty. Patient reports she feels she is doing better since the addition of Fluoxetine. Denies symptoms of mania/hypomania. Denies suicidal thinking. Denies auditory/visual hallucinations.  She does endorse sleep disturbance. She reports she is unable to fall asleep until 2-3am and usually wakes up around 6am for work each day, so she as averaging 3-4 hours of sleep per night. She usually is on her laptop of phone at night until she falls asleep. She has tried Melatonin in the past.    Past Medical History:  Diagnosis Date  . Anxiety   . Asthma   . Chronic back pain   . Depression   . Epidermoid cyst of skin of chest 05/14/2016  . Migraine headache   . Obesity    Past Surgical History:  Procedure Laterality Date  . BACK SURGERY    . CESAREAN SECTION    . FOOT SURGERY Left    Social History  Substance Use Topics  . Smoking status: Never Smoker  . Smokeless tobacco: Never Used  . Alcohol use 0.6 oz/week    1 Glasses of wine per week   family history includes Breast cancer in her mother.  ROS: negative except as noted in the HPI  Medications: Current Outpatient Prescriptions  Medication Sig Dispense Refill  . albuterol (PROVENTIL HFA;VENTOLIN HFA) 108 (90 Base) MCG/ACT inhaler Inhale into the lungs every 6 (six) hours as needed.    Marland Kitchen. buPROPion (WELLBUTRIN XL) 150 MG 24 hr tablet 1 tablet daily for a month then 1 tab twice a day 60 tablet 3  . FLUoxetine (PROZAC) 20 MG tablet Take 1 tablet (20 mg total) by mouth daily. 30 tablet 3  . HYDROmorphone (DILAUDID) 4 MG tablet     . HYDROmorphone HCl (EXALGO) 8 MG T24A SR tablet     . rizatriptan (MAXALT-MLT) 5 MG disintegrating tablet Take 1 tablet (5 mg  total) by mouth as needed for migraine. 10 tablet 5  . Topiramate ER (TROKENDI XR) 100 MG CP24 Take 100 mg by mouth at bedtime. 30 capsule 11  . traZODone (DESYREL) 50 MG tablet Take 0.5-1 tablets (25-50 mg total) by mouth at bedtime as needed for sleep. 30 tablet 3   No current facility-administered medications for this visit.    Allergies  Allergen Reactions  . Aspirin Anaphylaxis       Objective:  BP 95/63 (BP Location: Left Wrist, Cuff Size: Normal)   Pulse 84   Wt 241 lb (109.3 kg)   BMI 42.69 kg/m  Gen: well-groomed, morbidly, obese, cooperative, not ill-appearing, no distress Pulm: Normal work of breathing, normal phonation  Neuro: alert and oriented x 3, EOM's intact, no tremor MSK: moving all extremities, normal gait and station, no peripheral edema Psych: good eye contact, normal affect, euthymic mood, normal speech and thought content   Depression screen [facility]/9 07/09/2016 06/11/2016 05/14/2016 05/14/2016  Decreased Interest 0 2 1 1   Down, Depressed, Hopeless 1 2 2 2   PHQ -  2 Score 1 4 3 3   Altered sleeping 3 2 2 2   Tired, decreased energy 1 2 2 2   Change in appetite 1 1 1 1   Feeling bad or failure about yourself  1 2 2 2   Trouble concentrating 0 1 0 0  Moving slowly or fidgety/restless 0 1 1 1   Suicidal thoughts 0 1 1 1   PHQ-9 Score 7 14 12 12    GAD 7 : Generalized Anxiety Score 07/09/2016  Nervous, Anxious, on Edge 2  Control/stop worrying 1  Worry too much - different things 1  Trouble relaxing 2  Restless 1  Easily annoyed or irritable 1  Afraid - awful might happen 2  Total GAD 7 Score 10     No results found for this or any previous visit (from the past 72 hour(s)). No results found.    Assessment and Plan: 51 y.o. female with   1. Insomnia due to other mental disorder - starting Trazodone 25-50mg  as needed at bedtime - encouraged good sleep hygiene, to include no screens at bedtime - traZODone (DESYREL) 50 MG tablet; Take 0.5-1 tablets  (25-50 mg total) by mouth at bedtime as needed for sleep.  Dispense: 30 tablet; Refill: 3  2. Moderate episode of recurrent major depressive disorder (HCC) - PHQ9 score 7, significantly improved from 14, 4 weeks ago - will cont Fluoxetine 20mg  and Wellbutrin 150mg  - follow-up in 3 months   Patient education and anticipatory guidance given Patient agrees with treatment plan Follow-up in 3 months or sooner as needed if symptoms worsen or fail to improve  Levonne Hubert PA-C

## 2016-07-09 NOTE — Patient Instructions (Addendum)
-   Take Trazodone 1/2 - 1 tablet at bedtime as needed for sleep  Sleep Hygiene . Limiting daytime naps to 30 minutes . Napping does not make up for inadequate nighttime sleep. However, a short nap of 20-30 minutes can help to improve mood, alertness and performance.  . Avoiding stimulants such as  caffeine and nicotine after 2pm.  And when it comes to alcohol, moderation is key 4. While alcohol is well-known to help you fall asleep faster, too much close to bedtime can disrupt sleep in the second half of the night as the body begins to process the alcohol.    . Exercising to promote good quality sleep.  As little as 10 minutes of aerobic exercise, such as walking or cycling, can drastically improve nighttime sleep quality.  For the best night's sleep, most people should avoid strenuous workouts close to bedtime. However, the effect of intense nighttime exercise on sleep differs from person to person, so find out what works best for you.   . Steering clear of food that can be disruptive right before sleep.   Heavy or rich foods, fatty or fried meals, spicy dishes, citrus fruits, and carbonated drinks can trigger indigestion for some people. When this occurs close to bedtime, it can lead to painful heartburn that disrupts sleep. . Ensuring adequate exposure to natural light.  This is particularly important for individuals who may not venture outside frequently. Exposure to sunlight during the day, as well as darkness at night, helps to maintain a healthy sleep-wake cycle . Marland Kitchen. Establishing a regular relaxing bedtime routine.  A regular nightly routine helps the body recognize that it is bedtime. This could include taking warm shower or bath, reading a book, or light stretches. When possible, try to avoid emotionally upsetting conversations and activities before attempting to sleep. . Making sure that the sleep environment is pleasant.  Mattress and pillows should be comfortable. The bedroom should be cool -  between 60 and 67 degrees - for optimal sleep. Bright light from lamps, cell phone and TV screens can make it difficult to fall asleep4, so turn those light off or adjust them when possible. Consider using blackout curtains, eye shades, ear plugs, "white noise" machines, humidifiers, fans and other devices that can make the bedroom more relaxing.

## 2016-07-10 ENCOUNTER — Other Ambulatory Visit: Payer: Self-pay

## 2016-07-10 DIAGNOSIS — F331 Major depressive disorder, recurrent, moderate: Secondary | ICD-10-CM

## 2016-07-10 MED ORDER — BUPROPION HCL ER (XL) 150 MG PO TB24: 300.0000 mg | ORAL_TABLET | Freq: Every day | ORAL | 0 refills | Status: DC

## 2016-07-13 ENCOUNTER — Encounter: Payer: Self-pay | Admitting: Physician Assistant

## 2016-07-13 DIAGNOSIS — Z888 Allergy status to other drugs, medicaments and biological substances status: Secondary | ICD-10-CM

## 2016-07-13 DIAGNOSIS — L299 Pruritus, unspecified: Secondary | ICD-10-CM

## 2016-07-13 DIAGNOSIS — Z886 Allergy status to analgesic agent status: Secondary | ICD-10-CM

## 2016-07-14 DIAGNOSIS — Z886 Allergy status to analgesic agent status: Secondary | ICD-10-CM | POA: Insufficient documentation

## 2016-07-14 DIAGNOSIS — Z888 Allergy status to other drugs, medicaments and biological substances status: Secondary | ICD-10-CM | POA: Insufficient documentation

## 2016-07-14 MED ORDER — EPINEPHRINE (ANAPHYLAXIS) 1 MG/ML IJ SOLN: 0.3000 mg | Freq: Once | INTRAMUSCULAR | 1 refills | Status: DC | PRN

## 2016-07-14 MED ORDER — HYDROXYZINE HCL 25 MG PO TABS: 25.0000 mg | ORAL_TABLET | Freq: Three times a day (TID) | ORAL | 3 refills | Status: DC | PRN

## 2016-07-17 ENCOUNTER — Ambulatory Visit (INDEPENDENT_AMBULATORY_CARE_PROVIDER_SITE_OTHER): Payer: BLUE CROSS/BLUE SHIELD | Admitting: Physician Assistant

## 2016-07-17 ENCOUNTER — Encounter: Payer: Self-pay | Admitting: Physician Assistant

## 2016-07-17 VITALS — BP 137/84 | HR 90 | Temp 97.5°F | Wt 240.0 lb

## 2016-07-17 DIAGNOSIS — L720 Epidermal cyst: Secondary | ICD-10-CM

## 2016-07-17 MED ORDER — DOXYCYCLINE HYCLATE 100 MG PO TABS: 100.0000 mg | ORAL_TABLET | Freq: Two times a day (BID) | ORAL | 0 refills | Status: AC

## 2016-07-17 NOTE — Patient Instructions (Addendum)
-   Take Doxy twice a day for 1 week - Wear sunscreen and sun protective clothing - Follow-up in 1 week with me - Plan to have fistula surgery with Dr. Karie Schwalbe in 1-2 weeks once any potential infection is resolved

## 2016-07-17 NOTE — Progress Notes (Signed)
HPI:                                                                Cathy Oconnell is a 51 y.o. female who presents to [name]: Primary Care Sports Medicine today for follow-up of cyst  Patient with history of epidermoid cyst of chest wall that was excised on 06/22/16 due to abscess presents today complaining of redness and tenderness at the incision site. Incision site was left open at that time due to infection and has been healing well. Patient reports last night there was spontaneous drainage of serosanguinous fluid. She denies fever, chills, or purulent drainage.   Past Medical History:  Diagnosis Date  . Anxiety   . Asthma   . Chronic back pain   . Depression   . Epidermoid cyst of skin of chest 05/14/2016  . Migraine headache   . Obesity    Past Surgical History:  Procedure Laterality Date  . BACK SURGERY    . CESAREAN SECTION    . FOOT SURGERY Left    Social History  Substance Use Topics  . Smoking status: Never Smoker  . Smokeless tobacco: Never Used  . Alcohol use 0.6 oz/week    1 Glasses of wine per week   family history includes Breast cancer in her mother.  ROS: negative except as noted in the HPI  Medications: Current Outpatient Prescriptions  Medication Sig Dispense Refill  . albuterol (PROVENTIL HFA;VENTOLIN HFA) 108 (90 Base) MCG/ACT inhaler Inhale into the lungs every 6 (six) hours as needed.    Marland Kitchen buPROPion (WELLBUTRIN XL) 150 MG 24 hr tablet Take 2 tablets (300 mg total) by mouth daily. 180 tablet 0  . EPINEPHrine, Anaphylaxis, 1 MG/ML SOLN Inject 0.3 mg as directed once as needed. 1 vial 1  . FLUoxetine (PROZAC) 20 MG tablet Take 1 tablet (20 mg total) by mouth daily. 30 tablet 3  . HYDROmorphone (DILAUDID) 4 MG tablet     . HYDROmorphone HCl (EXALGO) 8 MG T24A SR tablet     . hydrOXYzine (ATARAX/VISTARIL) 25 MG tablet Take 1 tablet (25 mg total) by mouth 3 (three) times daily as needed for itching. 30 tablet 3  . rizatriptan  (MAXALT-MLT) 5 MG disintegrating tablet Take 1 tablet (5 mg total) by mouth as needed for migraine. 10 tablet 5  . Topiramate ER (TROKENDI XR) 100 MG CP24 Take 100 mg by mouth at bedtime. 30 capsule 11  . traZODone (DESYREL) 50 MG tablet Take 0.5-1 tablets (25-50 mg total) by mouth at bedtime as needed for sleep. 30 tablet 3   No current facility-administered medications for this visit.    Allergies  Allergen Reactions  . Aspirin Anaphylaxis       Objective:  BP 137/84   Pulse 90   Temp 97.5 F (36.4 C) (Oral)   Wt 240 lb (108.9 kg)   BMI 42.51 kg/m  Gen: well-groomed, cooperative, not ill-appearing, no distress Pulm: Normal work of breathing, normal phonation  Neuro: alert and oriented x 3, EOM's intact MSK: moving all extremities, normal gait and station, no peripheral edema Skin: 1cm area of tender induration and erythema on the right chest wall that expresses serosanguinous fluid, no fluctuance or purulence   No results found for  this or any previous visit (from the past 72 hour(s)). No results found.    Assessment and Plan: 51 y.o. female with   1. Epidermoid cyst of skin of chest - no signs of abscess today, but site is inflamed and irritated - consulted Dr. Benjamin Stainhekkekandam who performed the excision, who felt there to be a fistula at the excision site - plan to cover for any possible infection and then schedule fistula repair with Dr. Karie Schwalbe in 1-2 weeks - doxycycline (VIBRA-TABS) 100 MG tablet; Take 1 tablet (100 mg total) by mouth 2 (two) times daily.  Dispense: 14 tablet; Refill: 0  Patient education and anticipatory guidance given Patient agrees with treatment plan Follow-up in 1 week as needed if symptoms worsen or fail to improve  Levonne Hubertharley E. Bartosz Luginbill PA-C

## 2016-07-24 ENCOUNTER — Encounter: Payer: Self-pay | Admitting: Sports Medicine

## 2016-07-24 ENCOUNTER — Ambulatory Visit (INDEPENDENT_AMBULATORY_CARE_PROVIDER_SITE_OTHER): Payer: BLUE CROSS/BLUE SHIELD | Admitting: Physician Assistant

## 2016-07-24 ENCOUNTER — Encounter: Payer: Self-pay | Admitting: Physician Assistant

## 2016-07-24 ENCOUNTER — Ambulatory Visit (INDEPENDENT_AMBULATORY_CARE_PROVIDER_SITE_OTHER): Payer: BLUE CROSS/BLUE SHIELD | Admitting: Sports Medicine

## 2016-07-24 VITALS — BP 125/84 | HR 71 | Wt 237.0 lb

## 2016-07-24 DIAGNOSIS — L720 Epidermal cyst: Secondary | ICD-10-CM | POA: Diagnosis not present

## 2016-07-24 MED ORDER — OXYCODONE-ACETAMINOPHEN 5-325 MG PO TABS: 1.0000 | ORAL_TABLET | Freq: Three times a day (TID) | ORAL | 0 refills | Status: DC | PRN

## 2016-07-24 NOTE — Assessment & Plan Note (Signed)
Excision, sutures placed.  Return in one week for suture removal and wound check.  Oxycodone was written in error, she does have the pain provider that gives her Dilaudid. I discarded the oxycodone prescription.

## 2016-07-24 NOTE — Patient Instructions (Signed)
Please return at 2:30 today for excision of cyst with Dr. Karie Schwalbe

## 2016-07-24 NOTE — Progress Notes (Signed)
HPI:                                                                Cathy Oconnell is a 51 y.o. female who presents to [name]: Primary Care Sports Medicine today for cyst follow-up  Patient with history of epidermoid cyst of chest wall that was excised on 06/22/16 due to abscess presents today for follow-up. Patient was seen on 07/17/16 complaining of redness, tenderness, and spontaneous serosanguinous drainage from the incision site. She completed 7 days of Doxycyline and reports symptoms have resolved. Denies fever, chills, pain, or drainage.  Past Medical History:  Diagnosis Date  . Anxiety   . Asthma   . Chronic back pain   . Depression   . Epidermoid cyst of skin of chest 05/14/2016  . Migraine headache   . Obesity    Past Surgical History:  Procedure Laterality Date  . BACK SURGERY    . CESAREAN SECTION    . FOOT SURGERY Left    Social History  Substance Use Topics  . Smoking status: Never Smoker  . Smokeless tobacco: Never Used  . Alcohol use 0.6 oz/week    1 Glasses of wine per week   family history includes Breast cancer in her mother.  ROS: negative except as noted in the HPI  Medications: Current Outpatient Prescriptions  Medication Sig Dispense Refill  . albuterol (PROVENTIL HFA;VENTOLIN HFA) 108 (90 Base) MCG/ACT inhaler Inhale into the lungs every 6 (six) hours as needed.    Marland Kitchen buPROPion (WELLBUTRIN XL) 150 MG 24 hr tablet Take 2 tablets (300 mg total) by mouth daily. 180 tablet 0  . doxycycline (VIBRA-TABS) 100 MG tablet Take 1 tablet (100 mg total) by mouth 2 (two) times daily. 14 tablet 0  . EPINEPHrine, Anaphylaxis, 1 MG/ML SOLN Inject 0.3 mg as directed once as needed. 1 vial 1  . FLUoxetine (PROZAC) 20 MG tablet Take 1 tablet (20 mg total) by mouth daily. 30 tablet 3  . HYDROmorphone (DILAUDID) 4 MG tablet     . HYDROmorphone HCl (EXALGO) 8 MG T24A SR tablet     . hydrOXYzine (ATARAX/VISTARIL) 25 MG tablet Take 1 tablet (25 mg  total) by mouth 3 (three) times daily as needed for itching. 30 tablet 3  . rizatriptan (MAXALT-MLT) 5 MG disintegrating tablet Take 1 tablet (5 mg total) by mouth as needed for migraine. 10 tablet 5  . Topiramate ER (TROKENDI XR) 100 MG CP24 Take 100 mg by mouth at bedtime. 30 capsule 11  . traZODone (DESYREL) 50 MG tablet Take 0.5-1 tablets (25-50 mg total) by mouth at bedtime as needed for sleep. 30 tablet 3   No current facility-administered medications for this visit.    Allergies  Allergen Reactions  . Aspirin Anaphylaxis       Objective:  BP 125/84   Pulse 71   Wt 237 lb (107.5 kg)   BMI 41.98 kg/m  Gen: well-groomed, cooperative, not ill-appearing, no distress Pulm: Normal work of breathing, normal phonation Neuro: alert and oriented x 3, EOM's intact MSK: moving all extremities, normal gait and station, no peripheral edema Skin: well-healed 1cm nodule, no induration, fluctuance or purulence    No results found for this or any previous visit (from the  past 72 hour(s)). No results found.    Assessment and Plan: 51 y.o. female with   1. Epidermoid cyst of skin of chest - no signs of inflammation or infection on exam today - patient to return for planned surgical excision with Dr. Benjamin Stainhekkekandam at 2:45pm  Patient education and anticipatory guidance given Patient agrees with treatment plan  Levonne Hubertharley E. Maleia Weems PA-C

## 2016-07-24 NOTE — Progress Notes (Signed)
  Procedure:  Excision of chest sebaceous cyst, 2 cm Risks, benefits, and alternatives explained and consent obtained. Time out conducted. Surface prepped with alcohol. 5cc bupivacaine with epinephine infiltrated in a field block. Adequate anesthesia ensured. Area prepped and draped in a sterile fashion. Excision performed with: I made a linear incision across the sebaceous cyst, using sharp and blunt dissection was able to remove the cyst, it was attached to the overlying skin so elliptical incision was made removing a portion of skin attached to the cyst, I then placed 3-0 simple interrupted Ethilon sutures to close the incision. Hemostasis achieved. Pt stable.

## 2016-07-27 ENCOUNTER — Encounter: Payer: Self-pay | Admitting: Physician Assistant

## 2016-07-27 ENCOUNTER — Other Ambulatory Visit: Payer: Self-pay | Admitting: Physician Assistant

## 2016-07-27 DIAGNOSIS — F99 Mental disorder, not otherwise specified: Principal | ICD-10-CM

## 2016-07-27 DIAGNOSIS — F418 Other specified anxiety disorders: Secondary | ICD-10-CM | POA: Insufficient documentation

## 2016-07-27 DIAGNOSIS — F5105 Insomnia due to other mental disorder: Secondary | ICD-10-CM | POA: Insufficient documentation

## 2016-07-27 MED ORDER — TRAZODONE HCL 100 MG PO TABS: 100.0000 mg | ORAL_TABLET | Freq: Every day | ORAL | 3 refills | Status: DC

## 2016-07-27 NOTE — Telephone Encounter (Signed)
Patient requesting Cologuard for colon cancer screening. Patient is average risk and a good candidate. She has never had a colonoscopy. She has no family history of colon cancer. Cologuard will be ordered.

## 2016-07-28 ENCOUNTER — Ambulatory Visit (INDEPENDENT_AMBULATORY_CARE_PROVIDER_SITE_OTHER): Payer: BLUE CROSS/BLUE SHIELD | Admitting: Physician Assistant

## 2016-07-28 ENCOUNTER — Encounter: Payer: Self-pay | Admitting: Physician Assistant

## 2016-07-28 VITALS — BP 115/78 | HR 90 | Wt 237.0 lb

## 2016-07-28 DIAGNOSIS — T364X5A Adverse effect of tetracyclines, initial encounter: Secondary | ICD-10-CM | POA: Diagnosis not present

## 2016-07-28 DIAGNOSIS — L568 Other specified acute skin changes due to ultraviolet radiation: Secondary | ICD-10-CM

## 2016-07-28 MED ORDER — DESONIDE 0.05 % EX CREA: TOPICAL_CREAM | Freq: Two times a day (BID) | CUTANEOUS | 0 refills | Status: DC

## 2016-07-28 NOTE — Patient Instructions (Addendum)
Apply steroid cream to affected areas twice a day Avoid further sun exposure This rash should go away on its own within 1-2 weeks and will not scar Follow-up if no improvement in 2 weeks

## 2016-07-28 NOTE — Progress Notes (Signed)
HPI:                                                                Cathy Oconnell is a 51 y.o. female who presents to [facility]: Primary Care Sports Medicine today for "dry skin"  Patient reports "hive outbreak" on Saturday after spending a couple of hours out in the sun at her husband's soccer game. She describes a pruritic and burning rash on the right side of her neck and bilateral cheeks. She denies facial/lip/tongue swelling, difficulty swallowing, wheezing, or shortness of breath. She took a Benadryl, without much improvement. She recently completed a 7 day course of Doxycycline on 07/24/2016.   Past Medical History:  Diagnosis Date  . Anxiety   . Asthma   . Chronic back pain   . Depression   . Epidermoid cyst of skin of chest 05/14/2016  . Migraine headache   . Obesity    Past Surgical History:  Procedure Laterality Date  . BACK SURGERY    . CESAREAN SECTION    . FOOT SURGERY Left    Social History  Substance Use Topics  . Smoking status: Never Smoker  . Smokeless tobacco: Never Used  . Alcohol use 0.6 oz/week    1 Glasses of wine per week   family history includes Breast cancer in her mother.  ROS: negative except as noted in the HPI  Medications: Current Outpatient Prescriptions  Medication Sig Dispense Refill  . albuterol (PROVENTIL HFA;VENTOLIN HFA) 108 (90 Base) MCG/ACT inhaler Inhale into the lungs every 6 (six) hours as needed.    Marland Kitchen buPROPion (WELLBUTRIN XL) 150 MG 24 hr tablet Take 2 tablets (300 mg total) by mouth daily. 180 tablet 0  . EPINEPHrine 0.3 mg/0.3 mL IJ SOAJ injection INJECT AS DIRECTED ONCE AS NEEDED  1  . FLUoxetine (PROZAC) 20 MG tablet Take 1 tablet (20 mg total) by mouth daily. 30 tablet 3  . HYDROmorphone (DILAUDID) 4 MG tablet     . HYDROmorphone HCl (EXALGO) 8 MG T24A SR tablet     . hydrOXYzine (ATARAX/VISTARIL) 25 MG tablet Take 1 tablet (25 mg total) by mouth 3 (three) times daily as needed for itching. 30  tablet 3  . rizatriptan (MAXALT-MLT) 5 MG disintegrating tablet Take 1 tablet (5 mg total) by mouth as needed for migraine. 10 tablet 5  . Topiramate ER (TROKENDI XR) 100 MG CP24 Take 100 mg by mouth at bedtime. 30 capsule 11  . traZODone (DESYREL) 100 MG tablet Take 1 tablet (100 mg total) by mouth at bedtime. 30 tablet 3   No current facility-administered medications for this visit.    Allergies  Allergen Reactions  . Aspirin Anaphylaxis       Objective:  There were no vitals taken for this visit. Gen: well-groomed, cooperative, not ill-appearing, no distress HEENT: normocephalic, atraumatic, no facial swelling Pulm: Normal work of breathing, normal phonation  Neuro: alert and oriented x 3, EOM's intact MSK: moving all extremities, normal gait and station, no peripheral edema Skin: warm, dry, intact; rash is largely obscured by patient's make-up, appears to have a 2.5 cm dry, eczematous patch on her right lateral neck  No results found for this or any previous visit (from the past 72 hour(s)). No  results found.    Assessment and Plan: 51 y.o. female with   Adverse reaction to doxycycline, initial encounter - polymorphic light eruption due to doxy - desonide (DESOWEN) 0.05 % cream; Apply topically 2 (two) times daily.  Dispense: 15 g; Refill: 0  Photodermatitis due to sun - topical corticosteroid as above - avoid further sun exposure  Patient education and anticipatory guidance given Patient agrees with treatment plan Follow-up as needed if symptoms worsen or fail to improve  Levonne Hubertharley E. Moraima Burd PA-C

## 2016-07-31 ENCOUNTER — Ambulatory Visit (INDEPENDENT_AMBULATORY_CARE_PROVIDER_SITE_OTHER): Payer: BLUE CROSS/BLUE SHIELD | Admitting: Sports Medicine

## 2016-07-31 ENCOUNTER — Encounter: Payer: Self-pay | Admitting: Sports Medicine

## 2016-07-31 DIAGNOSIS — L720 Epidermal cyst: Secondary | ICD-10-CM

## 2016-07-31 NOTE — Progress Notes (Signed)
  Subjective:  1 week post excision of sebaceous cyst on the chest, doing well.  Objective: General: Well-developed, well-nourished, and in no acute distress. Skin: Incision is clean, dry, intact, all sutures removed.  Assessment/plan:   Epidermoid cyst of skin of chest Doing well post surgical excision, wound is intact and appears excellent. Sutures removed, return as needed.

## 2016-07-31 NOTE — Assessment & Plan Note (Signed)
Doing well post surgical excision, wound is intact and appears excellent. Sutures removed, return as needed.

## 2016-08-05 ENCOUNTER — Other Ambulatory Visit: Payer: Self-pay | Admitting: Physician Assistant

## 2016-08-05 DIAGNOSIS — T364X5A Adverse effect of tetracyclines, initial encounter: Secondary | ICD-10-CM

## 2016-08-05 MED ORDER — DESONIDE 0.05 % EX CREA: TOPICAL_CREAM | Freq: Two times a day (BID) | CUTANEOUS | 0 refills | Status: DC

## 2016-08-19 ENCOUNTER — Ambulatory Visit (INDEPENDENT_AMBULATORY_CARE_PROVIDER_SITE_OTHER): Payer: BLUE CROSS/BLUE SHIELD | Admitting: Physician Assistant

## 2016-08-19 ENCOUNTER — Ambulatory Visit (INDEPENDENT_AMBULATORY_CARE_PROVIDER_SITE_OTHER): Payer: BLUE CROSS/BLUE SHIELD

## 2016-08-19 ENCOUNTER — Encounter: Payer: Self-pay | Admitting: Physician Assistant

## 2016-08-19 ENCOUNTER — Other Ambulatory Visit: Payer: Self-pay

## 2016-08-19 VITALS — BP 125/79 | HR 68 | Resp 18 | Wt 237.5 lb

## 2016-08-19 DIAGNOSIS — M1712 Unilateral primary osteoarthritis, left knee: Secondary | ICD-10-CM | POA: Diagnosis not present

## 2016-08-19 DIAGNOSIS — F331 Major depressive disorder, recurrent, moderate: Secondary | ICD-10-CM

## 2016-08-19 DIAGNOSIS — M25562 Pain in left knee: Secondary | ICD-10-CM

## 2016-08-19 MED ORDER — FLUOXETINE HCL 20 MG PO TABS: 20.0000 mg | ORAL_TABLET | Freq: Every day | ORAL | 1 refills | Status: DC

## 2016-08-19 NOTE — Patient Instructions (Signed)
- This is most likely knee osteoarthritis - Plan for knee x-ray today - Formal physical therapy - Follow-up with Sports Medicine in 1 month    Knee Pain, Adult Knee pain in adults is common. It can be caused by many things, including:  Arthritis.  A fluid-filled sac (cyst) or growth in your knee.  An infection in your knee.  An injury that will not heal.  Damage, swelling, or irritation of the tissues that support your knee.  Knee pain is usually not a sign of a serious problem. The pain may go away on its own with time and rest. If it does not, a health care provider may order tests to find the cause of the pain. These may include:  Imaging tests, such as an X-ray, MRI, or ultrasound.  Joint aspiration. In this test, fluid is removed from the knee.  Arthroscopy. In this test, a lighted tube is inserted into knee and an image is projected onto a TV screen.  A biopsy. In this test, a sample of tissue is removed from the body and studied under a microscope.  Follow these instructions at home: Pay attention to any changes in your symptoms. Take these actions to relieve your pain. Activity  Rest your knee.  Do not do things that cause pain or make pain worse.  Avoid high-impact activities or exercises, such as running, jumping rope, or doing jumping jacks. General instructions  Take over-the-counter and prescription medicines only as told by your health care provider.  Raise (elevate) your knee above the level of your heart when you are sitting or lying down.  Sleep with a pillow under your knee.  If directed, apply ice to the knee: ? Put ice in a plastic bag. ? Place a towel between your skin and the bag. ? Leave the ice on for 20 minutes, 2-3 times a day.  Ask your health care provider if you should wear an elastic knee support.  Lose weight if you are overweight. Extra weight can put pressure on your knee.  Do not use any products that contain nicotine or  tobacco, such as cigarettes and e-cigarettes. Smoking may slow the healing of any bone and joint problems that you may have. If you need help quitting, ask your health care provider. Contact a health care provider if:  Your knee pain continues, changes, or gets worse.  You have a fever along with knee pain.  Your knee buckles or locks up.  Your knee swells, and the swelling becomes worse. Get help right away if:  Your knee feels warm to the touch.  You cannot move your knee.  You have severe pain in your knee.  You have chest pain.  You have trouble breathing. Summary  Knee pain in adults is common. It can be caused by many things, including, arthritis, infection, cysts, or injury.  Knee pain is usually not a sign of a serious problem, but if it does not go away, a health care provider may perform tests to know the cause of the pain.  Pay attention to any changes in your symptoms. Relieve your pain with rest, medicines, light activity, and use of ice.  Get help if your pain continues or becomes very severe, or if your knee buckles or locks up, or if you have chest pain or trouble breathing. This information is not intended to replace advice given to you by your health care provider. Make sure you discuss any questions you have with your health  care provider. Document Released: 12/07/2006 Document Revised: 01/31/2016 Document Reviewed: 01/31/2016 Elsevier Interactive Patient Education  [employer]2018 Elsevier Inc.

## 2016-08-19 NOTE — Progress Notes (Signed)
Hi Lennan,  Your x-ray does show arthritis, but no other abnormality.  Continue with the treatment plan we discussed today and follow-up with Dr. Karie Schwalbe in 1 month.  Best, Vinetta Bergamoharley

## 2016-08-19 NOTE — Progress Notes (Signed)
HPI:                                                                Cathy Oconnell is a 51 y.o. female who presents to [name]: Primary Care Sports Medicine today for left knee pain   Reports anterior knee pain, worse on the medial aspect x 1 month. Describes pain as a dull ache, persistent. Reports knee hurts most when she first rises to stand and walk. Denies known injury or trauma. No previous surgeries to the knee. Has tried ice and Tylenol and her narcotic pain medication, which do not provide much relief.   Past Medical History:  Diagnosis Date  . Anxiety   . Asthma   . Chronic back pain   . Depression   . Epidermoid cyst of skin of chest 05/14/2016  . Migraine headache   . Obesity    Past Surgical History:  Procedure Laterality Date  . BACK SURGERY    . CESAREAN SECTION    . FOOT SURGERY Left    Social History  Substance Use Topics  . Smoking status: Never Smoker  . Smokeless tobacco: Never Used  . Alcohol use 0.6 oz/week    1 Glasses of wine per week   family history includes Breast cancer in her mother.  ROS: negative except as noted in the HPI  Medications: Current Outpatient Prescriptions  Medication Sig Dispense Refill  . albuterol (PROVENTIL HFA;VENTOLIN HFA) 108 (90 Base) MCG/ACT inhaler Inhale into the lungs every 6 (six) hours as needed.    Marland Kitchen buPROPion (WELLBUTRIN XL) 150 MG 24 hr tablet Take 2 tablets (300 mg total) by mouth daily. 180 tablet 0  . desonide (DESOWEN) 0.05 % cream Apply topically 2 (two) times daily. 60 g 0  . EPINEPHrine 0.3 mg/0.3 mL IJ SOAJ injection INJECT AS DIRECTED ONCE AS NEEDED  1  . FLUoxetine (PROZAC) 20 MG tablet Take 1 tablet (20 mg total) by mouth daily. 30 tablet 3  . HYDROmorphone (DILAUDID) 4 MG tablet     . HYDROmorphone HCl (EXALGO) 8 MG T24A SR tablet     . hydrOXYzine (ATARAX/VISTARIL) 25 MG tablet Take 1 tablet (25 mg total) by mouth 3 (three) times daily as needed for itching. 30 tablet 3   . rizatriptan (MAXALT-MLT) 5 MG disintegrating tablet Take 1 tablet (5 mg total) by mouth as needed for migraine. 10 tablet 5  . Topiramate ER (TROKENDI XR) 100 MG CP24 Take 100 mg by mouth at bedtime. 30 capsule 11  . traZODone (DESYREL) 100 MG tablet Take 1 tablet (100 mg total) by mouth at bedtime. 30 tablet 3   No current facility-administered medications for this visit.    Allergies  Allergen Reactions  . Aspirin Anaphylaxis       Objective:  BP 125/79   Pulse 68   Resp 18   Wt 237 lb 8 oz (107.7 kg)   BMI 42.07 kg/m  Gen: well-groomed, cooperative, not ill-appearing, no distress Pulm: Normal work of breathing, normal phonation Neuro: alert and oriented x 3, EOM's intact, no tremor MSK: left knee without visible swelling or effusion, there is medial joint line tenderness and pain with flexion, joint is stable, negative anterior drawer, strength intact; normal gait and station, no  peripheral edema Psych: good eye contact, normal affect, normal speech and thought content    No results found for this or any previous visit (from the past 72 hour(s)). No results found.    Assessment and Plan: 51 y.o. female with   1. Anterior knee pain, left - DDx includes osteoarthritis - DG Knee Complete 4 Views Left; Future - Ambulatory referral to Physical Therapy - continue pain medication. Patient instructed could trial non-aspirin anti-inflammatory. Instructed to start with Ibuprofen 200mg  and keep epi-pen nearby. If tolerated, increase to 600mg  every 8 hours.  Patient education and anticipatory guidance given Patient agrees with treatment plan Follow-up with Sports Medicine in 4 weeks or sooner as needed if symptoms worsen or fail to improve  Levonne Hubertharley E. Dawne Casali PA-C

## 2016-08-28 ENCOUNTER — Other Ambulatory Visit: Payer: Self-pay | Admitting: Physician Assistant

## 2016-08-28 DIAGNOSIS — L299 Pruritus, unspecified: Secondary | ICD-10-CM

## 2016-09-16 ENCOUNTER — Encounter: Payer: Self-pay | Admitting: Sports Medicine

## 2016-09-16 ENCOUNTER — Other Ambulatory Visit: Payer: Self-pay

## 2016-09-16 ENCOUNTER — Ambulatory Visit (INDEPENDENT_AMBULATORY_CARE_PROVIDER_SITE_OTHER): Payer: BLUE CROSS/BLUE SHIELD | Admitting: Sports Medicine

## 2016-09-16 DIAGNOSIS — L720 Epidermal cyst: Secondary | ICD-10-CM

## 2016-09-16 DIAGNOSIS — M1712 Unilateral primary osteoarthritis, left knee: Secondary | ICD-10-CM | POA: Diagnosis not present

## 2016-09-16 DIAGNOSIS — F5105 Insomnia due to other mental disorder: Secondary | ICD-10-CM

## 2016-09-16 MED ORDER — TRAZODONE HCL 100 MG PO TABS: 100.0000 mg | ORAL_TABLET | Freq: Every day | ORAL | 1 refills | Status: DC

## 2016-09-16 NOTE — Progress Notes (Addendum)
   Subjective:    I'm seeing this patient as a consultation for:  Cathy Oconnell Cummings, PA-C  CC: Left knee pain  HPI: This is a pleasant 51 year old female, she has left knee pain, she's done oral medications, some rehabilitation exercises without any improvement, pain is moderate, persistent, she gets swelling, no mechanical symptoms, localized mostly at the medial joint line and under the patella.   Additionally we removed a sebaceous cyst from her right upper chest, this is healed well but she does have somewhat of a hypertrophic scar.  Past medical history, Surgical history, Family history not pertinant except as noted below, Social history, Allergies, and medications have been entered into the medical record, reviewed, and no changes needed.   Review of Systems: No headache, visual changes, nausea, vomiting, diarrhea, constipation, dizziness, abdominal pain, skin rash, fevers, chills, night sweats, weight loss, swollen lymph nodes, body aches, joint swelling, muscle aches, chest pain, shortness of breath, mood changes, visual or auditory hallucinations.   Objective:   General: Well Developed, well nourished, and in no acute distress.  Neuro:  Extra-ocular muscles intact, able to move all 4 extremities, sensation grossly intact.  Deep tendon reflexes tested were normal. Psych: Alert and oriented, mood congruent with affect. ENT:  Ears and nose appear unremarkable.  Hearing grossly normal. Neck: Unremarkable overall appearance, trachea midline.  No visible thyroid enlargement. Eyes: Conjunctivae and lids appear unremarkable.  Pupils equal and round. Skin: Warm and dry, no rashes noted. There is a hypertrophic scar the right upper chest from a prior sebaceous cyst excision, not to the point of a keloid. Cardiovascular: Pulses palpable, no extremity edema. Left Knee: Visibly swollen, palpable effusion, tenderness the medial joint line of the patellar facets ROM normal in flexion and  extension and lower leg rotation. Ligaments with solid consistent endpoints including ACL, PCL, LCL, MCL. Negative Mcmurray's and provocative meniscal tests. Non painful patellar compression. Patellar and quadriceps tendons unremarkable. Hamstring and quadriceps strength is normal.  Procedure: Real-time Ultrasound Guided aspiration/injection of left knee Device: GE Logiq E  Verbal informed consent obtained.  Time-out conducted.  Noted no overlying erythema, induration, or other signs of local infection.  Skin prepped in a sterile fashion.  Local anesthesia: Topical Ethyl chloride.  With sterile technique and under real time ultrasound guidance:  Using an 18-gauge needle aspirated about 20 mL straw-colored fluid, syringe switched and 1 mL kenalog 40, 2 mL lidocaine, 2 mL bupivacaine injected easily. Completed without difficulty  Pain immediately resolved suggesting accurate placement of the medication.  Advised to call if fevers/chills, erythema, induration, drainage, or persistent bleeding.  Images permanently stored and available for review in the ultrasound unit.  Impression: Technically successful ultrasound guided injection.  Impression and Recommendations:   This case required medical decision making of moderate complexity.  Patellofemoral arthritis of left knee Aspiration and injection as above, strapped with compressive dressing. Formal physical therapy, return in 6 weeks.  Epidermoid cyst of skin of chest Doing extremely well, hypertrophic scar visible, we can do an intralesional injection should she desire to have this shrink down.

## 2016-09-16 NOTE — Assessment & Plan Note (Signed)
Doing extremely well, hypertrophic scar visible, we can do an intralesional injection should she desire to have this shrink down.

## 2016-09-16 NOTE — Assessment & Plan Note (Signed)
Aspiration and injection as above, strapped with compressive dressing. Formal physical therapy, return in 6 weeks.

## 2016-09-20 ENCOUNTER — Encounter: Payer: Self-pay | Admitting: Physician Assistant

## 2016-09-28 ENCOUNTER — Telehealth: Payer: Self-pay | Admitting: *Deleted

## 2016-09-28 ENCOUNTER — Encounter: Payer: Self-pay | Admitting: Physician Assistant

## 2016-09-28 NOTE — Telephone Encounter (Signed)
Received inactivity notice from cologuard since the kit had not been turned into [employer].left message on patient vm that cologaurd order is good for 365 days if still wanted to do this or we can refer for colonoscopy. Asked that she call back and let us know what she wants [place] to do

## 2016-10-02 ENCOUNTER — Ambulatory Visit (INDEPENDENT_AMBULATORY_CARE_PROVIDER_SITE_OTHER): Payer: BLUE CROSS/BLUE SHIELD | Admitting: Physician Assistant

## 2016-10-02 ENCOUNTER — Encounter: Payer: Self-pay | Admitting: Physician Assistant

## 2016-10-02 VITALS — BP 95/58 | HR 72 | Temp 97.7°F | Wt 237.0 lb

## 2016-10-02 DIAGNOSIS — N939 Abnormal uterine and vaginal bleeding, unspecified: Secondary | ICD-10-CM | POA: Diagnosis not present

## 2016-10-02 DIAGNOSIS — Z803 Family history of malignant neoplasm of breast: Secondary | ICD-10-CM | POA: Diagnosis not present

## 2016-10-02 DIAGNOSIS — N951 Menopausal and female climacteric states: Secondary | ICD-10-CM | POA: Diagnosis not present

## 2016-10-02 LAB — CBC
HCT: 39.3 % (ref 35.0–45.0)
HEMOGLOBIN: 12.6 g/dL (ref 11.7–15.5)
MCH: 25.7 pg — ABNORMAL LOW (ref 27.0–33.0)
MCHC: 32.1 g/dL (ref 32.0–36.0)
MCV: 80.2 fL (ref 80.0–100.0)
MPV: 10.8 fL (ref 7.5–12.5)
PLATELETS: 182 10*3/uL (ref 140–400)
RBC: 4.9 MIL/uL (ref 3.80–5.10)
RDW: 14.3 % (ref 11.0–15.0)
WBC: 5.6 10*3/uL (ref 3.8–10.8)

## 2016-10-02 LAB — T4, FREE: Free T4: 1.2 ng/dL (ref 0.8–1.8)

## 2016-10-02 LAB — TSH: TSH: 0.71 m[IU]/L

## 2016-10-02 LAB — T3, FREE: T3, Free: 3.4 pg/mL (ref 2.3–4.2)

## 2016-10-02 NOTE — Patient Instructions (Signed)
-   Plan to go downstairs for labs today - You will be contacted within 3-5 business days about genetic counseling - If bleeding becomes more frequent (every 21 days or less) or prolonged (lasting more than 5 days) or you have symptoms (fatigue, shortness of breath, passing out), please follow-up - If possible, find out from a relative if your grandmother had uterine cancer   Abnormal Uterine Bleeding Abnormal uterine bleeding can affect women at various stages in life, including teenagers, women in their reproductive years, pregnant women, and women who have reached menopause. Several kinds of uterine bleeding are considered abnormal, including:  Bleeding or spotting between periods.  Bleeding after sexual intercourse.  Bleeding that is heavier or more than normal.  Periods that last longer than usual.  Bleeding after menopause.  Many cases of abnormal uterine bleeding are minor and simple to treat, while others are more serious. Any type of abnormal bleeding should be evaluated by your health care provider. Treatment will depend on the cause of the bleeding. Follow these instructions at home: Monitor your condition for any changes. The following actions may help to alleviate any discomfort you are experiencing:  Avoid the use of tampons and douches as directed by your health care provider.  Change your pads frequently.  You should get regular pelvic exams and Pap tests. Keep all follow-up appointments for diagnostic tests as directed by your health care provider. Contact a health care provider if:  Your bleeding lasts more than 1 week.  You feel dizzy at times. Get help right away if:  You pass out.  You are changing pads every 15 to 30 minutes.  You have abdominal pain.  You have a fever.  You become sweaty or weak.  You are passing large blood clots from the vagina.  You start to feel nauseous and vomit. This information is not intended to replace advice given to you  by your health care provider. Make sure you discuss any questions you have with your health care provider. Document Released: 02/09/2005 Document Revised: 07/24/2015 Document Reviewed: 09/08/2012 Elsevier Interactive Patient Education  2017 [employer]

## 2016-10-02 NOTE — Progress Notes (Signed)
HPI:                                                                Cathy Oconnell is a 51 y.o. female who [facility] today for abnormal uterine bleeding  Reports she had not had a menstrual period since March 2018. She reports painful cramping and heavy bleeding for 5 days beginning on Sunday this week. Reports this happened once before approximately 1 year ago and she was evaluated by her gynecologist. Last Pap smear was 2017 and NILM, no history of abnormal Paps.  Reports family history of reproductive cancer in maternal grandmother, unsure if it was uterine or cervical.  Reports a history of menorrhagia since menarche that improved after her first pregnancy.  Patient also reports positive family history of breast cancer in mother and maternal aunt, both deceased due to cancer. Mammogram is UTD and normal. She is requesting genetic testing.  Past Medical History:  Diagnosis Date  . Anxiety   . Asthma   . Chronic back pain   . Depression   . Epidermoid cyst of skin of chest 05/14/2016  . Migraine headache   . Obesity    Past Surgical History:  Procedure Laterality Date  . BACK SURGERY    . CESAREAN SECTION    . FOOT SURGERY Left    Social History  Substance Use Topics  . Smoking status: Never Smoker  . Smokeless tobacco: Never Used  . Alcohol use 0.6 oz/week    1 Glasses of wine per week   family history includes Breast cancer in her mother.  ROS: negative except as noted in the HPI  Medications: Current Outpatient Prescriptions  Medication Sig Dispense Refill  . albuterol (PROVENTIL HFA;VENTOLIN HFA) 108 (90 Base) MCG/ACT inhaler Inhale into the lungs every 6 (six) hours as needed.    Marland Kitchen. buPROPion (WELLBUTRIN XL) 150 MG 24 hr tablet Take 2 tablets (300 mg total) by mouth daily. 180 tablet 0  . desonide (DESOWEN) 0.05 % cream Apply topically 2 (two) times daily. 60 g 0  . EPINEPHrine 0.3 mg/0.3 mL IJ SOAJ  injection INJECT AS DIRECTED ONCE AS NEEDED  1  . FLUoxetine (PROZAC) 20 MG tablet Take 1 tablet (20 mg total) by mouth daily. 90 tablet 1  . HYDROmorphone (DILAUDID) 4 MG tablet     . HYDROmorphone HCl (EXALGO) 8 MG T24A SR tablet     . hydrOXYzine (ATARAX/VISTARIL) 25 MG tablet TAKE 1 TABLET (25 MG TOTAL) BY MOUTH 3 (THREE) TIMES DAILY AS NEEDED FOR ITCHING. 30 tablet 3  . rizatriptan (MAXALT-MLT) 5 MG disintegrating tablet Take 1 tablet (5 mg total) by mouth as needed for migraine. 10 tablet 5  . Topiramate ER (TROKENDI XR) 100 MG CP24 Take 100 mg by mouth at bedtime. 30 capsule 11  . traZODone (DESYREL) 100 MG tablet Take 1 tablet (100 mg total) by mouth at bedtime. 90 tablet 1   No current facility-administered medications for this visit.    Allergies  Allergen Reactions  . Aspirin Anaphylaxis       Objective:  BP (!) 95/58   Pulse 72   Temp 97.7 F (36.5 C)   Wt 237 lb (107.5 kg)   LMP 09/27/2016  SpO2 99%   BMI 41.98 kg/m  Gen: morbidly obese female, not ill-appearing, no distress Pulm: Normal work of breathing, normal phonation Neuro: alert and oriented x 3, no tremor MSK: extremities atraumatic, normal gait and station Skin: no rashes on exposed skin, no cyanosis Psych: well-groomed, cooperative, good eye contact, normal speech, normal thought content   No results found for this or any previous visit (from the past 72 hour(s)). No results found.    Assessment and Plan: 51 y.o. female with   1. Abnormal uterine and vaginal bleeding, unspecified - checking labs today. Symptoms seem most consistent with periomenopause - plan to monitor closely. If bleeding becomes more frequent (every 21 days or less) or prolonged (lasting more than 5 days) or you have symptoms (fatigue, shortness of breath, passing out), plan to refer to GYN for possible endometrial biopsy - TSH - T3, free - T4, free - Prolactin - Follicle stimulating hormone - Luteinizing hormone -  CBC  2. Family history of breast cancer in mother - patient has positive family history of breast cancer in first degree relative (mother) and maternal aunt. Both died of breast cancer - Ambulatory referral to Genetics  3. Perimenopause - FSH and LH   Patient education and anticipatory guidance given Patient agrees with treatment plan Follow-up as needed if symptoms worsen or fail to improve  Levonne Hubert PA-C

## 2016-10-03 LAB — PROLACTIN: Prolactin: 9.3 ng/mL

## 2016-10-03 LAB — LUTEINIZING HORMONE: LH: 4.6 m[IU]/mL

## 2016-10-03 LAB — FOLLICLE STIMULATING HORMONE: FSH: 10.7 m[IU]/mL

## 2016-10-05 ENCOUNTER — Encounter: Payer: Self-pay | Admitting: Physician Assistant

## 2016-10-05 NOTE — Progress Notes (Signed)
Hi Leenah,  Your labs are all normal, indicating that this is likely perimenopause.  The plan does not change.  If bleeding becomes more frequent (every 21 days or less) or prolonged (lasting more than 5 days) or you have symptoms (fatigue, shortness of breath, passing out), please follow-up and I will refer you to gynecology.  Let me know if you have any questions or concerns.  Best, Vinetta Bergamoharley

## 2016-10-06 ENCOUNTER — Other Ambulatory Visit: Payer: Self-pay | Admitting: Physician Assistant

## 2016-10-06 DIAGNOSIS — F331 Major depressive disorder, recurrent, moderate: Secondary | ICD-10-CM

## 2016-10-09 ENCOUNTER — Ambulatory Visit: Payer: BLUE CROSS/BLUE SHIELD | Admitting: Physician Assistant

## 2016-10-09 DIAGNOSIS — Z0189 Encounter for other specified special examinations: Secondary | ICD-10-CM

## 2016-10-15 ENCOUNTER — Encounter: Payer: Self-pay | Admitting: Physician Assistant

## 2016-10-15 DIAGNOSIS — N938 Other specified abnormal uterine and vaginal bleeding: Secondary | ICD-10-CM

## 2016-10-16 ENCOUNTER — Telehealth: Payer: Self-pay | Admitting: *Deleted

## 2016-10-16 NOTE — Telephone Encounter (Signed)
Patient left an email stating that the only symptom she is having is migraines. I see that she was seen for abnomal bleeding so Im not sure if she is just ging an FYI about  Called the patient and got her vm left her a message in regard to other concern she had and gave her # to genetic counseling since she has not yet heard from them.

## 2016-10-18 ENCOUNTER — Encounter: Payer: Self-pay | Admitting: Physician Assistant

## 2016-10-19 ENCOUNTER — Other Ambulatory Visit: Payer: Self-pay | Admitting: Physician Assistant

## 2016-10-19 DIAGNOSIS — L299 Pruritus, unspecified: Secondary | ICD-10-CM

## 2016-10-21 ENCOUNTER — Encounter: Payer: Self-pay | Admitting: Physician Assistant

## 2016-10-21 ENCOUNTER — Ambulatory Visit (INDEPENDENT_AMBULATORY_CARE_PROVIDER_SITE_OTHER): Payer: BLUE CROSS/BLUE SHIELD

## 2016-10-21 DIAGNOSIS — N83202 Unspecified ovarian cyst, left side: Secondary | ICD-10-CM | POA: Diagnosis not present

## 2016-10-21 DIAGNOSIS — N938 Other specified abnormal uterine and vaginal bleeding: Secondary | ICD-10-CM

## 2016-10-21 HISTORY — DX: Unspecified ovarian cyst, left side: N83.202

## 2016-10-21 NOTE — Addendum Note (Signed)
Addended by: Levonne HubertUMMINGS, CHARLEY E on: 10/21/2016 05:23 PM   Modules accepted: Orders

## 2016-10-21 NOTE — Telephone Encounter (Signed)
Ultrasound showed a left ovarian cyst Unfortunately, they were not able to visualize the lining of the uterus well enough to measure it. This information is important to rule out endometrial cancer. I am going to refer you to OB/GYN for follow-up. They will be able to follow the ovarian cyst as well, which is most likely benign.

## 2016-10-23 ENCOUNTER — Ambulatory Visit: Payer: BLUE CROSS/BLUE SHIELD | Admitting: Physician Assistant

## 2016-10-23 DIAGNOSIS — Z0189 Encounter for other specified special examinations: Secondary | ICD-10-CM

## 2016-10-28 ENCOUNTER — Encounter: Payer: Self-pay | Admitting: Physician Assistant

## 2016-10-28 ENCOUNTER — Telehealth: Payer: Self-pay

## 2016-10-28 NOTE — Telephone Encounter (Signed)
Called pt left message for pat to call office to schedule appt. Received referral from primary care.

## 2016-10-30 ENCOUNTER — Ambulatory Visit (INDEPENDENT_AMBULATORY_CARE_PROVIDER_SITE_OTHER): Payer: BLUE CROSS/BLUE SHIELD | Admitting: Sports Medicine

## 2016-10-30 ENCOUNTER — Encounter: Payer: Self-pay | Admitting: Sports Medicine

## 2016-10-30 DIAGNOSIS — M1712 Unilateral primary osteoarthritis, left knee: Secondary | ICD-10-CM | POA: Diagnosis not present

## 2016-10-30 NOTE — Assessment & Plan Note (Signed)
Doing well after aspiration and injection, encouraged her to do the rehabilitation exercises, return as needed.

## 2016-10-30 NOTE — Progress Notes (Signed)
  Subjective:    CC: Follow-up  HPI: Patellofemoral osteoarthritis: Resolved after aspiration and injection.  Past medical history:  Negative.  See flowsheet/record as well for more information.  Surgical history: Negative.  See flowsheet/record as well for more information.  Family history: Negative.  See flowsheet/record as well for more information.  Social history: Negative.  See flowsheet/record as well for more information.  Allergies, and medications have been entered into the medical record, reviewed, and no changes needed.   Review of Systems: No fevers, chills, night sweats, weight loss, chest pain, or shortness of breath.   Objective:    General: Well Developed, well nourished, and in no acute distress.  Neuro: Alert and oriented x3, extra-ocular muscles intact, sensation grossly intact.  HEENT: Normocephalic, atraumatic, pupils equal round reactive to light, neck supple, no masses, no lymphadenopathy, thyroid nonpalpable.  Skin: Warm and dry, no rashes. Cardiac: Regular rate and rhythm, no murmurs rubs or gallops, no lower extremity edema.  Respiratory: Clear to auscultation bilaterally. Not using accessory muscles, speaking in full sentences. Left Knee: Normal to inspection with no erythema or effusion or obvious bony abnormalities. Palpation normal with no warmth or joint line tenderness or patellar tenderness or condyle tenderness. ROM normal in flexion and extension and lower leg rotation. Ligaments with solid consistent endpoints including ACL, PCL, LCL, MCL. Negative Mcmurray's and provocative meniscal tests. Non painful patellar compression. Patellar and quadriceps tendons unremarkable. Hamstring and quadriceps strength is normal.  Impression and Recommendations:    Patellofemoral arthritis of left knee Doing well after aspiration and injection, encouraged her to do the rehabilitation exercises, return as  needed.  ___________________________________________ Ihor Austinhomas J. Benjamin Stainhekkekandam, M.D., ABFM., CAQSM. Primary Care and Sports Medicine [facility]  Adjunct Instructor of Family Medicine  [employer]

## 2016-11-02 DIAGNOSIS — Z79891 Long term (current) use of opiate analgesic: Secondary | ICD-10-CM | POA: Diagnosis not present

## 2016-11-02 DIAGNOSIS — M5416 Radiculopathy, lumbar region: Secondary | ICD-10-CM | POA: Diagnosis not present

## 2016-11-02 DIAGNOSIS — G894 Chronic pain syndrome: Secondary | ICD-10-CM | POA: Diagnosis not present

## 2016-11-02 DIAGNOSIS — M4716 Other spondylosis with myelopathy, lumbar region: Secondary | ICD-10-CM | POA: Diagnosis not present

## 2016-11-02 DIAGNOSIS — M5136 Other intervertebral disc degeneration, lumbar region: Secondary | ICD-10-CM | POA: Diagnosis not present

## 2016-11-03 ENCOUNTER — Ambulatory Visit (INDEPENDENT_AMBULATORY_CARE_PROVIDER_SITE_OTHER): Payer: BLUE CROSS/BLUE SHIELD | Admitting: Physician Assistant

## 2016-11-03 ENCOUNTER — Encounter: Payer: Self-pay | Admitting: Physician Assistant

## 2016-11-03 ENCOUNTER — Other Ambulatory Visit: Payer: Self-pay | Admitting: Physician Assistant

## 2016-11-03 ENCOUNTER — Other Ambulatory Visit: Payer: Self-pay

## 2016-11-03 VITALS — BP 134/85 | HR 80 | Wt 235.0 lb

## 2016-11-03 DIAGNOSIS — G43109 Migraine with aura, not intractable, without status migrainosus: Secondary | ICD-10-CM

## 2016-11-03 MED ORDER — RIZATRIPTAN BENZOATE 10 MG PO TBDP: 10.0000 mg | ORAL_TABLET | ORAL | 3 refills | Status: DC | PRN

## 2016-11-03 MED ORDER — ATENOLOL 25 MG PO TABS: 25.0000 mg | ORAL_TABLET | Freq: Every day | ORAL | 1 refills | Status: DC

## 2016-11-03 MED ORDER — RIZATRIPTAN BENZOATE 10 MG PO TBDP: 10.0000 mg | ORAL_TABLET | ORAL | 1 refills | Status: DC | PRN

## 2016-11-03 NOTE — Progress Notes (Signed)
HPI:                                                                Cathy KanarisMichelle D Oconnell is a 51 y.o. female who [facility] today for migraine follow-up  Reports long-standing history of migraines since age 51. Had advanced head imaging over 10 years ago. Reports she has aura preceding most of her headaches. Reports occasional numbness in her fingertips and dizziness. Also endorses associated photophobia, phonophobia and nausea. Reports currently having 3 migraines per week and they feel like her typical headache. Denies vision change, focal weakness, gait disturbance. Currenlty on Trokendi 100 mg daily. Has been on Topamax in the past. Using Maxalt 5 mg as abortive therapy that works most of the time. Reports needing 2 tablets to abort her headaches.   Past Medical History:  Diagnosis Date  . Anxiety   . Asthma   . Chronic back pain   . Depression   . Epidermoid cyst of skin of chest 05/14/2016  . Migraine headache   . Obesity   . Ovarian cyst, left 10/21/2016   2.3 cm left ovarian cystic lesion with indeterminate but probably benign characteristics. Recommend continued followup by transvaginal pelvic ultrasound in 6-12 weeks   Past Surgical History:  Procedure Laterality Date  . BACK SURGERY    . CESAREAN SECTION    . FOOT SURGERY Left    Social History  Substance Use Topics  . Smoking status: Never Smoker  . Smokeless tobacco: Never Used  . Alcohol use 0.6 oz/week    1 Glasses of wine per week   family history includes Breast cancer in her maternal aunt and mother.  ROS: negative except as noted in the HPI  Medications: Current Outpatient Prescriptions  Medication Sig Dispense Refill  . albuterol (PROVENTIL HFA;VENTOLIN HFA) 108 (90 Base) MCG/ACT inhaler Inhale into the lungs every 6 (six) hours as needed.    Marland Kitchen. buPROPion (WELLBUTRIN XL) 150 MG 24 hr tablet TAKE 2 TABLETS (300 MG TOTAL) BY MOUTH DAILY. 180 tablet 0   . desonide (DESOWEN) 0.05 % cream Apply topically 2 (two) times daily. 60 g 0  . EPINEPHrine 0.3 mg/0.3 mL IJ SOAJ injection INJECT AS DIRECTED ONCE AS NEEDED  1  . FLUoxetine (PROZAC) 20 MG tablet Take 1 tablet (20 mg total) by mouth daily. 90 tablet 1  . HYDROmorphone (DILAUDID) 4 MG tablet     . HYDROmorphone HCl (EXALGO) 8 MG T24A SR tablet     . hydrOXYzine (ATARAX/VISTARIL) 25 MG tablet TAKE 1 TABLET (25 MG TOTAL) BY MOUTH 3 (THREE) TIMES DAILY AS NEEDED FOR ITCHING. 60 tablet 1  . rizatriptan (MAXALT-MLT) 5 MG disintegrating tablet Take 1 tablet (5 mg total) by mouth as needed for migraine. 10 tablet 5  . Topiramate ER (TROKENDI XR) 100 MG CP24 Take 100 mg by mouth at bedtime. 30 capsule 11  . traZODone (DESYREL) 100 MG tablet Take 1 tablet (100 mg total) by mouth at bedtime. 90 tablet 1   No current facility-administered medications for this visit.    Allergies  Allergen Reactions  . Aspirin Anaphylaxis    Objective:  BP 134/85   Pulse 80   Wt 235 lb (106.6 kg)  BMI 41.63 kg/m  Gen: well-groomed, not ill-appearing, no acute distress HEENT: head normocephalic, atraumatic; conjunctiva and cornea clear, oropharynx clear, moist mucus membranes, poor dentition Pulm: Normal work of breathing, normal phonation Neuro:  cranial nerves II-XII intact, no nystagmus, no papilledema, normal finger-to-nose, normal heel-to-shin, negative pronator drift, normal coordination, DTR's intact, normal tone, no tremor MSK: strength 5/5 and symmetric in bilateral upper and lower extremities, normal gait and station, negative Romberg Mental Status: alert and oriented x 3, speech articulate, and thought processes clear and goal-directed   No results found for this or any previous visit (from the past 72 hour(s)). No results found.    Assessment and Plan: 51 y.o. female with   1. Migraine with aura and without status migrainosus, not intractable - reassuring neuro exam, no status migrainosus.  Discussed option to repeat advanced head imaging. Patient would like to defer until trying an additional agent for prophylaxis - patient has failed Topamax and Trokendi. Will trial beta-blocker for migraine prophylaxis for 6 weeks. If headaches are not controlled, plan to refer to Neuro/Headache clinic for further eval and management - Increasing Maxalt to 10 mg - rizatriptan (MAXALT-MLT) 10 MG disintegrating tablet; Take 1 tablet (10 mg total) by mouth as needed for migraine. May repeat in 2 hours if needed  Dispense: 10 tablet; Refill: 3 - atenolol (TENORMIN) 25 MG tablet; Take 1 tablet (25 mg total) by mouth daily.  Dispense: 30 tablet; Refill: 1  Patient education and anticipatory guidance given Patient agrees with treatment plan Follow-up as needed if symptoms worsen or fail to improve  Levonne Hubert PA-C

## 2016-11-03 NOTE — Addendum Note (Signed)
Addended by: Levonne HubertUMMINGS, CHARLEY E on: 11/03/2016 08:37 PM   Modules accepted: Orders

## 2016-11-03 NOTE — Patient Instructions (Signed)
Atenolol tablets What is this medicine? ATENOLOL (a TEN oh lole) is a beta-blocker. Beta-blockers reduce the workload on the heart and help it to beat more regularly. This medicine is used to treat high blood pressure and to prevent chest pain. It is also used to protect the heart during a heart attack and to prevent an additional heart attack from occurring. This medicine may be used for other purposes; ask your health care provider or pharmacist if you have questions. COMMON BRAND NAME(S): Tenormin What should I tell my health care provider before I take this medicine? They need to know if you have any of these conditions: -diabetes -heart or vessel disease like slow heart rate, worsening heart failure, heart block, sick sinus syndrome or Raynaud's disease -kidney disease -lung or breathing disease, like asthma or emphysema -pheochromocytoma -thyroid disease -an unusual or allergic reaction to atenolol, other beta-blockers, medicines, foods, dyes, or preservatives -pregnant or trying to get pregnant -breast-feeding How should I use this medicine? Take this medicine by mouth with a drink of water. Follow the directions on the prescription label. This medicine may be taken with or without food. Take your medicine at regular intervals. Do not take more medicine than directed. Do not stop taking this medicine suddenly. This could lead to serious heart-related effects. Talk to your pediatrician regarding the use of this medicine in children. Special care may be needed. Overdosage: If you think you have taken too much of this medicine contact a poison control center or emergency room at once. NOTE: This medicine is only for you. Do not share this medicine with others. What if I miss a dose? If you miss a dose, take it as soon as you can. If it is almost time for your next dose, take only that dose. Do not take double or extra doses. What may interact with this medicine? This medicine may  interact with the following medications: -certain medicines for blood pressure, heart disease, irregular heart beat -clonidine -digoxin -diuretics -dobutamine -epinephrine -isoproterenol -NSAIDs, medicines for pain and inflammation, like ibuprofen or naproxen -reserpine This list may not describe all possible interactions. Give your health care provider a list of all the medicines, herbs, non-prescription drugs, or dietary supplements you use. Also tell them if you smoke, drink alcohol, or use illegal drugs. Some items may interact with your medicine. What should I watch for while using this medicine? Visit your doctor or health care professional for regular check ups. Check your blood pressure and pulse rate regularly. Ask your health care professional what your blood pressure and pulse rate should be, and when you should contact him or her. You may get drowsy or dizzy. Do not drive, use machinery, or do anything that needs mental alertness until you know how this medicine affects you. Do not stand or sit up quickly. Alcohol may interfere with the effect of this medicine. Avoid alcoholic drinks. This medicine can affect blood sugar levels. If you have diabetes, check with your doctor or health care professional before you change your diet or the dose of your diabetic medicine. Do not treat yourself for coughs, colds, or pain while you are taking this medicine without asking your doctor or health care professional for advice. Some ingredients may increase your blood pressure. What side effects may I notice from receiving this medicine? Side effects that you should report to your doctor or health care professional as soon as possible: -allergic reactions like skin rash, itching or hives, swelling of the  face, lips, or tongue -breathing problems -changes in vision -chest pain -cold, tingling, or numb hands or feet -depression -fast, irregular heartbeat -feeling faint or lightheaded,  falls -fever with sore throat -rapid weight gain -swollen ankles, legs Side effects that usually do not require medical attention (report to your doctor or health care professional if they continue or are bothersome): -anxiety, nervous -diarrhea -dry skin -change in sex drive or performance -headache -nightmares or trouble sleeping -short term memory loss -stomach upset -unusually tired This list may not describe all possible side effects. Call your doctor for medical advice about side effects. You may report side effects to FDA at 1-800-FDA-1088. Where should I keep my medicine? Keep out of the reach of children. Store at room temperature between 20 and 25 degrees C (68 and 77 degrees F). Close tightly and protect from light. Throw away any unused medicine after the expiration date. NOTE: This sheet is a summary. It may not cover all possible information. If you have questions about this medicine, talk to your doctor, pharmacist, or health care provider.  2018 Elsevier/Gold Standard (2012-10-16 14:21:28)

## 2016-11-04 ENCOUNTER — Other Ambulatory Visit: Payer: Self-pay | Admitting: Sports Medicine

## 2016-12-07 ENCOUNTER — Other Ambulatory Visit: Payer: Self-pay | Admitting: Physician Assistant

## 2016-12-07 DIAGNOSIS — G43109 Migraine with aura, not intractable, without status migrainosus: Secondary | ICD-10-CM

## 2016-12-16 ENCOUNTER — Encounter: Payer: Self-pay | Admitting: Physician Assistant

## 2016-12-16 ENCOUNTER — Ambulatory Visit (INDEPENDENT_AMBULATORY_CARE_PROVIDER_SITE_OTHER): Payer: BLUE CROSS/BLUE SHIELD | Admitting: Physician Assistant

## 2016-12-16 VITALS — BP 140/85 | HR 80 | Wt 237.0 lb

## 2016-12-16 DIAGNOSIS — J4531 Mild persistent asthma with (acute) exacerbation: Secondary | ICD-10-CM

## 2016-12-16 DIAGNOSIS — L299 Pruritus, unspecified: Secondary | ICD-10-CM

## 2016-12-16 DIAGNOSIS — Z79899 Other long term (current) drug therapy: Secondary | ICD-10-CM | POA: Diagnosis not present

## 2016-12-16 DIAGNOSIS — G43109 Migraine with aura, not intractable, without status migrainosus: Secondary | ICD-10-CM

## 2016-12-16 DIAGNOSIS — Z6841 Body Mass Index (BMI) 40.0 and over, adult: Secondary | ICD-10-CM | POA: Insufficient documentation

## 2016-12-16 DIAGNOSIS — Z87892 Personal history of anaphylaxis: Secondary | ICD-10-CM | POA: Diagnosis not present

## 2016-12-16 DIAGNOSIS — J4541 Moderate persistent asthma with (acute) exacerbation: Secondary | ICD-10-CM | POA: Insufficient documentation

## 2016-12-16 HISTORY — DX: Personal history of anaphylaxis: Z87.892

## 2016-12-16 MED ORDER — IPRATROPIUM-ALBUTEROL 0.5-2.5 (3) MG/3ML IN SOLN
3.0000 mL | Freq: Once | RESPIRATORY_TRACT | Status: AC
  Administered 2016-12-16: 3 mL via RESPIRATORY_TRACT

## 2016-12-16 MED ORDER — HYDROXYZINE HCL 25 MG PO TABS: 25.0000 mg | ORAL_TABLET | Freq: Three times a day (TID) | ORAL | 3 refills | Status: DC | PRN

## 2016-12-16 MED ORDER — ALBUTEROL SULFATE HFA 108 (90 BASE) MCG/ACT IN AERS: 1.0000 | INHALATION_SPRAY | RESPIRATORY_TRACT | 2 refills | Status: DC | PRN

## 2016-12-16 MED ORDER — BUDESONIDE 180 MCG/ACT IN AEPB: 1.0000 | INHALATION_SPRAY | Freq: Two times a day (BID) | RESPIRATORY_TRACT | 3 refills | Status: AC

## 2016-12-16 MED ORDER — ATENOLOL 25 MG PO TABS: 50.0000 mg | ORAL_TABLET | Freq: Every day | ORAL | 5 refills | Status: DC

## 2016-12-16 NOTE — Progress Notes (Signed)
HPI:                                                                Cathy Oconnell is a 51 y.o. female who [facility] today for migraine follow-up  Migraines: Patient started on Atenolol 25mg  daily 6 weeks ago. Reports reduced frequency and severity of her headaches. She is having to use her abortive medication 4 times over this 6 week period. Tolerating medication without side effects.  Asthma: nonsmoker, reports previous diagnosis of asthma. Not on maintenance medication. Currently using Albuterol 3-4 times per day. Reports difficulty taking a deep breath and SOB. Has had a non-productive cough for 2 weeks. No fevers, chills, hemoptysis, wheezing, or chest pain.  Past Medical History:  Diagnosis Date  . Anxiety   . Asthma   . Chronic back pain   . Depression   . Epidermoid cyst of skin of chest 05/14/2016  . 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 Migraine headache   . Obesity   . Ovarian cyst, left 10/21/2016   2.3 cm left ovarian cystic lesion with indeterminate but probably benign characteristics. Recommend continued followup by transvaginal pelvic ultrasound in 6-12 weeks   Past Surgical History:  Procedure Laterality Date  . BACK SURGERY    . CESAREAN SECTION    . FOOT SURGERY Left    Social History  Substance Use Topics  . Smoking status: Never Smoker  . Smokeless tobacco: Never Used  . Alcohol use 0.6 oz/week    1 Glasses of wine per week   family history includes Breast cancer in her maternal aunt and mother.  ROS: negative except as noted in the HPI  Medications: Current Outpatient Prescriptions  Medication Sig Dispense Refill  . albuterol (PROVENTIL HFA;VENTOLIN HFA) 108 (90 Base) MCG/ACT inhaler Inhale 1-2 puffs into the lungs every 4 (four) hours as needed for wheezing or shortness of breath. 2 Inhaler 2  . atenolol (TENORMIN) 25 MG tablet Take 2 tablets (50 mg total) by  mouth daily. 60 tablet 5  . buPROPion (WELLBUTRIN XL) 150 MG 24 hr tablet TAKE 2 TABLETS (300 MG TOTAL) BY MOUTH DAILY. 180 tablet 0  . desonide (DESOWEN) 0.05 % cream Apply topically 2 (two) times daily. 60 g 0  . EPINEPHrine 0.3 mg/0.3 mL IJ SOAJ injection INJECT AS DIRECTED ONCE AS NEEDED  1  . FLUoxetine (PROZAC) 20 MG tablet Take 1 tablet (20 mg total) by mouth daily. 90 tablet 1  . HYDROmorphone (DILAUDID) 4 MG tablet     . HYDROmorphone HCl (EXALGO) 8 MG T24A SR tablet     . hydrOXYzine (ATARAX/VISTARIL) 25 MG tablet Take 1 tablet (25 mg total) by mouth 3 (three) times daily as needed for itching. 60 tablet 3  . rizatriptan (MAXALT-MLT) 10 MG disintegrating tablet Take 1 tablet (10 mg total) by mouth as needed for migraine. May repeat in 2 hours if needed 18 tablet 1  . traZODone (DESYREL) 100 MG tablet Take 1 tablet (100 mg total) by mouth at bedtime. 90 tablet 1  . budesonide (PULMICORT) 180 MCG/ACT inhaler Inhale 1 puff into the lungs 2 (two) times daily. 1 each 3   No current facility-administered medications for this visit.  Allergies  Allergen Reactions  . Aspirin Anaphylaxis       Objective:  BP 140/85   Pulse 80   Wt 237 lb (107.5 kg)   BMI 41.98 kg/m  Gen:  alert, not ill-appearing, no distress, appropriate for age, morbidly obese female HEENT: head normocephalic without obvious abnormality, conjunctiva and cornea clear, trachea midline Pulm: Normal work of breathing, normal phonation, diminished expiratory phase, LUL expiratory wheeze, no rales or rhonchi CV: Normal rate, regular rhythm, s1 and s2 distinct, no murmurs, clicks or rubs  Neuro: alert and oriented x 3, no tremor MSK: extremities atraumatic, normal gait and station Skin: intact, no rashes on exposed skin,  no cyanosis Psych: well-groomed, cooperative, good eye contact, euthymic mood, affect mood-congruent, speech is articulate, and thought processes clear and goal-directed   No results found for  this or any previous visit (from the past 72 hour(s)). No results found.    Assessment and Plan: 51 y.o. female with   1. Encounter for long-term (current) use of medications   2. Migraine with aura and without status migrainosus, not intractable - headaches significantly reduced with beta blocker for migraine prophylaxis. Will increase to 50mg  daily to see if we can reduce headache days further. If not tolerated or no reduction in headache days, will return to 25mg  daily - atenolol (TENORMIN) 25 MG tablet; Take 2 tablets (50 mg total) by mouth daily.  Dispense: 60 tablet; Refill: 5  3. Pruritus - hydrOXYzine (ATARAX/VISTARIL) 25 MG tablet; Take 1 tablet (25 mg total) by mouth 3 (three) times daily as needed for itching.  Dispense: 60 tablet; Refill: 3  4. Mild persistent asthma with acute exacerbation - Duoneb given in office today - starting daily ICS - PFT's in 4 weeks - albuterol (PROVENTIL HFA;VENTOLIN HFA) 108 (90 Base) MCG/ACT inhaler; Inhale 1-2 puffs into the lungs every 4 (four) hours as needed for wheezing or shortness of breath.  Dispense: 2 Inhaler; Refill: 2 - budesonide (PULMICORT) 180 MCG/ACT inhaler; Inhale 1 puff into the lungs 2 (two) times daily.  Dispense: 1 each; Refill: 3 - ipratropium-albuterol (DUONEB) 0.5-2.5 (3) MG/3ML nebulizer solution 3 mL; Take 3 mLs by nebulization once.   Patient education and anticipatory guidance given Patient agrees with treatment plan Follow-up in 4 weeks for spirometry or sooner as needed if symptoms worsen or fail to improve  Levonne Hubert PA-C

## 2016-12-16 NOTE — Patient Instructions (Addendum)
-   start Pulmicort maintenance inhaler for asthma: 1 puff daily, rinse mouth well after use - cont Albuterol rescue inhaler 1-2 puffs every 4-6 hours as needed for shortness of breath, wheezing - if using your rescue inhaler more than once a day, return sooner - recommend follow-up pulmonary function test in 4 weeks

## 2016-12-21 ENCOUNTER — Telehealth: Payer: Self-pay | Admitting: *Deleted

## 2016-12-21 NOTE — Telephone Encounter (Signed)
Pre Authorization sent to cover my meds.[id number]

## 2016-12-23 NOTE — Telephone Encounter (Signed)
Can we just find out what the preferred inhaled corticosteroid is for her plan? It does not need to be Pulmicort. I was guessing what was on her formulary

## 2016-12-23 NOTE — Telephone Encounter (Signed)
The patient will need to call her insurance to see what is preferred.called and left message on patient's  vm to call her insurance company and get a list of inhalers on preferred list and let us know.

## 2016-12-23 NOTE — Telephone Encounter (Signed)
[facility] and they are requesting information on all medications patient has tried and failed, or drug contraindication or adverse event for this condition. Please advise.   Please list all medications the patient has tried specific to the diagnosis and specify below:    1) Therapeutic failure, including length of therapy for each drug:    2) Drug(s) contraindicated:    3) Adverse event (e.g., toxicity, allergy) for each drug:

## 2016-12-23 NOTE — Telephone Encounter (Signed)
The problem was the prescription was for 60 days. Pharmacy changed to 30 days. Patient is aware and will pick up later today.

## 2016-12-28 DIAGNOSIS — M4716 Other spondylosis with myelopathy, lumbar region: Secondary | ICD-10-CM | POA: Diagnosis not present

## 2016-12-28 DIAGNOSIS — M5416 Radiculopathy, lumbar region: Secondary | ICD-10-CM | POA: Diagnosis not present

## 2016-12-28 DIAGNOSIS — G894 Chronic pain syndrome: Secondary | ICD-10-CM | POA: Diagnosis not present

## 2016-12-28 DIAGNOSIS — Z79891 Long term (current) use of opiate analgesic: Secondary | ICD-10-CM | POA: Diagnosis not present

## 2016-12-28 DIAGNOSIS — M5136 Other intervertebral disc degeneration, lumbar region: Secondary | ICD-10-CM | POA: Diagnosis not present

## 2017-01-13 ENCOUNTER — Other Ambulatory Visit: Payer: BLUE CROSS/BLUE SHIELD

## 2017-01-25 ENCOUNTER — Encounter: Payer: Self-pay | Admitting: Physician Assistant

## 2017-01-25 ENCOUNTER — Other Ambulatory Visit: Payer: Self-pay | Admitting: Physician Assistant

## 2017-01-25 DIAGNOSIS — L299 Pruritus, unspecified: Secondary | ICD-10-CM

## 2017-01-25 DIAGNOSIS — G43109 Migraine with aura, not intractable, without status migrainosus: Secondary | ICD-10-CM

## 2017-01-25 DIAGNOSIS — T364X5A Adverse effect of tetracyclines, initial encounter: Secondary | ICD-10-CM

## 2017-01-25 DIAGNOSIS — F331 Major depressive disorder, recurrent, moderate: Secondary | ICD-10-CM

## 2017-01-26 MED ORDER — HYDROXYZINE HCL 25 MG PO TABS: 25.0000 mg | ORAL_TABLET | Freq: Three times a day (TID) | ORAL | 3 refills | Status: DC | PRN

## 2017-01-26 MED ORDER — RIZATRIPTAN BENZOATE 10 MG PO TBDP: 10.0000 mg | ORAL_TABLET | ORAL | 1 refills | Status: AC | PRN

## 2017-01-29 MED ORDER — DESONIDE 0.05 % EX CREA: TOPICAL_CREAM | Freq: Two times a day (BID) | CUTANEOUS | 0 refills | Status: DC

## 2017-01-29 MED ORDER — BUPROPION HCL ER (XL) 150 MG PO TB24: 300.0000 mg | ORAL_TABLET | Freq: Every day | ORAL | 0 refills | Status: DC

## 2017-03-22 DIAGNOSIS — M4716 Other spondylosis with myelopathy, lumbar region: Secondary | ICD-10-CM | POA: Diagnosis not present

## 2017-03-22 DIAGNOSIS — G894 Chronic pain syndrome: Secondary | ICD-10-CM | POA: Diagnosis not present

## 2017-03-22 DIAGNOSIS — Z79891 Long term (current) use of opiate analgesic: Secondary | ICD-10-CM | POA: Diagnosis not present

## 2017-03-22 DIAGNOSIS — M5136 Other intervertebral disc degeneration, lumbar region: Secondary | ICD-10-CM | POA: Diagnosis not present

## 2017-03-22 DIAGNOSIS — M5416 Radiculopathy, lumbar region: Secondary | ICD-10-CM | POA: Diagnosis not present

## 2017-03-26 ENCOUNTER — Encounter: Payer: Self-pay | Admitting: Physician Assistant

## 2017-03-26 ENCOUNTER — Ambulatory Visit (INDEPENDENT_AMBULATORY_CARE_PROVIDER_SITE_OTHER): Payer: BLUE CROSS/BLUE SHIELD | Admitting: Physician Assistant

## 2017-03-26 ENCOUNTER — Other Ambulatory Visit: Payer: Self-pay | Admitting: Physician Assistant

## 2017-03-26 VITALS — BP 144/76 | HR 83 | Temp 98.3°F

## 2017-03-26 DIAGNOSIS — J019 Acute sinusitis, unspecified: Secondary | ICD-10-CM

## 2017-03-26 DIAGNOSIS — B9689 Other specified bacterial agents as the cause of diseases classified elsewhere: Secondary | ICD-10-CM

## 2017-03-26 DIAGNOSIS — G43109 Migraine with aura, not intractable, without status migrainosus: Secondary | ICD-10-CM

## 2017-03-26 DIAGNOSIS — R0982 Postnasal drip: Secondary | ICD-10-CM | POA: Diagnosis not present

## 2017-03-26 LAB — POCT RAPID STREP A (OFFICE): RAPID STREP A SCREEN: NEGATIVE

## 2017-03-26 MED ORDER — CEFUROXIME AXETIL 250 MG PO TABS: 250.0000 mg | ORAL_TABLET | Freq: Two times a day (BID) | ORAL | 0 refills | Status: AC

## 2017-03-26 MED ORDER — IPRATROPIUM BROMIDE 0.06 % NA SOLN: 2.0000 | Freq: Four times a day (QID) | NASAL | 0 refills | Status: DC | PRN

## 2017-03-26 NOTE — Progress Notes (Signed)
HPI:                                                                Cathy Oconnell is a 52 y.o. female [facility] today for sore throat/URI symptoms  URI   This is a new problem. The current episode started 1 to 4 weeks ago (x 2 weeks). The problem has been unchanged. There has been no fever (99.9). Associated symptoms include congestion, coughing (non-productive, worse at night), headaches, rhinorrhea and sinus pain. She has tried acetaminophen (Nyquil/Dayquil) for the symptoms. The treatment provided mild relief.    [facility] 07/09/2016 06/11/2016 05/14/2016 05/14/2016  Decreased Interest 0 2 1 1   Down, Depressed, Hopeless 1 2 2 2   PHQ - 2 Score 1 4 3 3   Altered sleeping 3 2 2 2   Tired, decreased energy 1 2 2 2   Change in appetite 1 1 1 1   Feeling bad or failure about yourself  1 2 2 2   Trouble concentrating 0 1 0 0  Moving slowly or fidgety/restless 0 1 1 1   Suicidal thoughts 0 1 1 1   PHQ-9 Score 7 14 12 12     GAD 7 : Generalized Anxiety Score 07/09/2016  Nervous, Anxious, on Edge 2  Control/stop worrying 1  Worry too much - different things 1  Trouble relaxing 2  Restless 1  Easily annoyed or irritable 1  Afraid - awful might happen 2  Total GAD 7 Score 10      Past Medical History:  Diagnosis Date  . Anxiety   . Asthma   . Chronic back pain   . Depression   . Epidermoid cyst of skin of chest 05/14/2016  . 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 Migraine headache   . Obesity   . Ovarian cyst, left 10/21/2016   2.3 cm left ovarian cystic lesion with indeterminate but probably benign characteristics. Recommend continued followup by transvaginal pelvic ultrasound in 6-12 weeks   Past Surgical History:  Procedure Laterality Date  . BACK SURGERY    . CESAREAN SECTION    . FOOT SURGERY Left    Social History   Tobacco Use  . Smoking status: Never Smoker  .  Smokeless tobacco: Never Used  Substance Use Topics  . Alcohol use: Yes    Alcohol/week: 0.6 oz    Types: 1 Glasses of wine per week   family history includes Breast cancer in her maternal aunt and mother.    ROS: negative except as noted in the HPI  Medications: Current Outpatient Medications  Medication Sig Dispense Refill  . albuterol (PROVENTIL HFA;VENTOLIN HFA) 108 (90 Base) MCG/ACT inhaler Inhale 1-2 puffs into the lungs every 4 (four) hours as needed for wheezing or shortness of breath. 2 Inhaler 2  . atenolol (TENORMIN) 25 MG tablet Take 2 tablets (50 mg total) by mouth daily. 60 tablet 5  . budesonide (PULMICORT) 180 MCG/ACT inhaler Inhale 1 puff into the lungs 2 (two) times daily. 1 each 3  . buPROPion (WELLBUTRIN XL) 150 MG 24 hr tablet Take 2 tablets (300 mg total) by mouth daily. 180 tablet 0  . desonide (DESOWEN) 0.05 % cream Apply topically 2 (two) times  daily. 60 g 0  . EPINEPHrine 0.3 mg/0.3 mL IJ SOAJ injection INJECT AS DIRECTED ONCE AS NEEDED  1  . FLUoxetine (PROZAC) 20 MG tablet Take 1 tablet (20 mg total) by mouth daily. 90 tablet 1  . HYDROmorphone (DILAUDID) 4 MG tablet     . HYDROmorphone HCl (EXALGO) 8 MG T24A SR tablet     . hydrOXYzine (ATARAX/VISTARIL) 25 MG tablet Take 1 tablet (25 mg total) by mouth 3 (three) times daily as needed for itching. 60 tablet 3  . rizatriptan (MAXALT-MLT) 10 MG disintegrating tablet Take 1 tablet (10 mg total) by mouth as needed for migraine. May repeat in 2 hours if needed 18 tablet 1  . traZODone (DESYREL) 100 MG tablet Take 1 tablet (100 mg total) by mouth at bedtime. 90 tablet 1  . cefUROXime (CEFTIN) 250 MG tablet Take 1 tablet (250 mg total) by mouth 2 (two) times daily with a meal for 10 days. 20 tablet 0  . ipratropium (ATROVENT) 0.06 % nasal spray Place 2 sprays into both nostrils 4 (four) times daily as needed. 15 mL 0   No current facility-administered medications for this visit.    Allergies  Allergen Reactions   . Aspirin Anaphylaxis       Objective:  BP (!) 144/76   Pulse 83   Temp 98.3 F (36.8 C) (Oral)  Gen:  alert, not ill-appearing, no distress, appropriate for age HEENT: head normocephalic without obvious abnormality, conjunctiva and cornea clear, oropharynx with erythema and post-nasal secretions, no edema or exudates, nasal mucosa edematous, there is frontal sinus tenderness, neck supple, no adenopathy, trachea midline Pulm: Normal work of breathing, normal phonation, clear to auscultation bilaterally, no wheezes, rales or rhonchi CV: Normal rate, regular rhythm, s1 and s2 distinct, no murmurs, clicks or rubs  Neuro: alert and oriented x 3, no tremor MSK: extremities atraumatic, normal gait and station Skin: intact, no rashes on exposed skin, no jaundice, no cyanosis   Results for orders placed or performed in visit on 03/26/17 (from the past 72 hour(s))  POCT rapid strep A     Status: Normal   Collection Time: 03/26/17 11:35 AM  Result Value Ref Range   Rapid Strep A Screen Negative Negative   No results found.    Assessment and Plan: 52 y.o. female with   1. Acute bacterial sinusitis - POCT rapid strep A negative - pharyngitis and cough is due to post-nasal secretions - given 2 weeks of persistent symptoms and frontal sinus tenderness, will treat empirically for bacterial sinusitis. Continue symptomatic care - cefUROXime (CEFTIN) 250 MG tablet; Take 1 tablet (250 mg total) by mouth 2 (two) times daily with a meal for 10 days.  Dispense: 20 tablet; Refill: 0  2. Postnasal discharge - ipratropium (ATROVENT) 0.06 % nasal spray; Place 2 sprays into both nostrils 4 (four) times daily as needed.  Dispense: 15 mL; Refill:0    Patient education and anticipatory guidance given Patient agrees with treatment plan Follow-up as needed if symptoms worsen or fail to improve  Levonne Hubert PA-C

## 2017-03-26 NOTE — Patient Instructions (Signed)
- antibiotic twice a day for 10 days - netty pot - atrovent nasal spray  - make sure you don't over do it on the Tylenol and cause medication overuse headache. Limit Tylenol to 6-8 days out of the month   Sinusitis, Adult Sinusitis is soreness and inflammation of your sinuses. Sinuses are hollow spaces in the bones around your face. Your sinuses are located:  Around your eyes.  In the middle of your forehead.  Behind your nose.  In your cheekbones.  Your sinuses and nasal passages are lined with a stringy fluid (mucus). Mucus normally drains out of your sinuses. When your nasal tissues become inflamed or swollen, the mucus can become trapped or blocked so air cannot flow through your sinuses. This allows bacteria, viruses, and funguses to grow, which leads to infection. Sinusitis can develop quickly and last for 7?10 days (acute) or for more than 12 weeks (chronic). Sinusitis often develops after a cold. What are the causes? This condition is caused by anything that creates swelling in the sinuses or stops mucus from draining, including:  Allergies.  Asthma.  Bacterial or viral infection.  Abnormally shaped bones between the nasal passages.  Nasal growths that contain mucus (nasal polyps).  Narrow sinus openings.  Pollutants, such as chemicals or irritants in the air.  A foreign object stuck in the nose.  A fungal infection. This is rare.  What increases the risk? The following factors may make you more likely to develop this condition:  Having allergies or asthma.  Having had a recent cold or respiratory tract infection.  Having structural deformities or blockages in your nose or sinuses.  Having a weak immune system.  Doing a lot of swimming or diving.  Overusing nasal sprays.  Smoking.  What are the signs or symptoms? The main symptoms of this condition are pain and a feeling of pressure around the affected sinuses. Other symptoms include:  Upper  toothache.  Earache.  Headache.  Bad breath.  Decreased sense of smell and taste.  A cough that may get worse at night.  Fatigue.  Fever.  Thick drainage from your nose. The drainage is often green and it may contain pus (purulent).  Stuffy nose or congestion.  Postnasal drip. This is when extra mucus collects in the throat or back of the nose.  Swelling and warmth over the affected sinuses.  Sore throat.  Sensitivity to light.  How is this diagnosed? This condition is diagnosed based on symptoms, a medical history, and a physical exam. To find out if your condition is acute or chronic, your health care provider may:  Look in your nose for signs of nasal polyps.  Tap over the affected sinus to check for signs of infection.  View the inside of your sinuses using an imaging device that has a light attached (endoscope).  If your health care provider suspects that you have chronic sinusitis, you may also:  Be tested for allergies.  Have a sample of mucus taken from your nose (nasal culture) and checked for bacteria.  Have a mucus sample examined to see if your sinusitis is related to an allergy.  If your sinusitis does not respond to treatment and it lasts longer than 8 weeks, you may have an MRI or CT scan to check your sinuses. These scans also help to determine how severe your infection is. In rare cases, a bone biopsy may be done to rule out more serious types of fungal sinus disease. How is this  treated? Treatment for sinusitis depends on the cause and whether your condition is chronic or acute. If a virus is causing your sinusitis, your symptoms will go away on their own within 10 days. You may be given medicines to relieve your symptoms, including:  Topical nasal decongestants. They shrink swollen nasal passages and let mucus drain from your sinuses.  Antihistamines. These drugs block inflammation that is triggered by allergies. This can help to ease swelling in  your nose and sinuses.  Topical nasal corticosteroids. These are nasal sprays that ease inflammation and swelling in your nose and sinuses.  Nasal saline washes. These rinses can help to get rid of thick mucus in your nose.  If your condition is caused by bacteria, you will be given an antibiotic medicine. If your condition is caused by a fungus, you will be given an antifungal medicine. Surgery may be needed to correct underlying conditions, such as narrow nasal passages. Surgery may also be needed to remove polyps. Follow these instructions at home: Medicines  Take, use, or apply over-the-counter and prescription medicines only as told by your health care provider. These may include nasal sprays.  If you were prescribed an antibiotic medicine, take it as told by your health care provider. Do not stop taking the antibiotic even if you start to feel better. Hydrate and Humidify  Drink enough water to keep your urine clear or pale yellow. Staying hydrated will help to thin your mucus.  Use a cool mist humidifier to keep the humidity level in your home above 50%.  Inhale steam for 10-15 minutes, 3-4 times a day or as told by your health care provider. You can do this in the bathroom while a hot shower is running.  Limit your exposure to cool or dry air. Rest  Rest as much as possible.  Sleep with your head raised (elevated).  Make sure to get enough sleep each night. General instructions  Apply a warm, moist washcloth to your face 3-4 times a day or as told by your health care provider. This will help with discomfort.  Wash your hands often with soap and water to reduce your exposure to viruses and other germs. If soap and water are not available, use hand sanitizer.  Do not smoke. Avoid being around people who are smoking (secondhand smoke).  Keep all follow-up visits as told by your health care provider. This is important. Contact a health care provider if:  You have a  fever.  Your symptoms get worse.  Your symptoms do not improve within 10 days. Get help right away if:  You have a severe headache.  You have persistent vomiting.  You have pain or swelling around your face or eyes.  You have vision problems.  You develop confusion.  Your neck is stiff.  You have trouble breathing. This information is not intended to replace advice given to you by your health care provider. Make sure you discuss any questions you have with your health care provider. Document Released: 02/09/2005 Document Revised: 10/06/2015 Document Reviewed: 12/05/2014 Elsevier Interactive Patient Education  [employer].

## 2017-04-12 DIAGNOSIS — Z803 Family history of malignant neoplasm of breast: Secondary | ICD-10-CM | POA: Diagnosis not present

## 2017-04-12 DIAGNOSIS — Z8041 Family history of malignant neoplasm of ovary: Secondary | ICD-10-CM | POA: Diagnosis not present

## 2017-04-26 ENCOUNTER — Encounter: Payer: Self-pay | Admitting: Physician Assistant

## 2017-05-10 ENCOUNTER — Ambulatory Visit (INDEPENDENT_AMBULATORY_CARE_PROVIDER_SITE_OTHER): Payer: BLUE CROSS/BLUE SHIELD | Admitting: Physician Assistant

## 2017-05-10 ENCOUNTER — Encounter: Payer: Self-pay | Admitting: Physician Assistant

## 2017-05-10 VITALS — BP 144/92 | HR 75 | Wt 240.0 lb

## 2017-05-10 DIAGNOSIS — L299 Pruritus, unspecified: Secondary | ICD-10-CM | POA: Diagnosis not present

## 2017-05-10 DIAGNOSIS — B370 Candidal stomatitis: Secondary | ICD-10-CM

## 2017-05-10 DIAGNOSIS — R0683 Snoring: Secondary | ICD-10-CM | POA: Diagnosis not present

## 2017-05-10 DIAGNOSIS — Z9189 Other specified personal risk factors, not elsewhere classified: Secondary | ICD-10-CM

## 2017-05-10 DIAGNOSIS — G4719 Other hypersomnia: Secondary | ICD-10-CM | POA: Diagnosis not present

## 2017-05-10 MED ORDER — FLUCONAZOLE 200 MG PO TABS: 200.0000 mg | ORAL_TABLET | Freq: Once | ORAL | 0 refills | Status: AC

## 2017-05-10 MED ORDER — HYDROXYZINE HCL 10 MG PO TABS: 10.0000 mg | ORAL_TABLET | Freq: Three times a day (TID) | ORAL | 3 refills | Status: DC | PRN

## 2017-05-10 MED ORDER — AEROCHAMBER PLUS FLO-VU LARGE MISC: 1.0000 | Freq: Once | 0 refills | Status: AC

## 2017-05-10 NOTE — Progress Notes (Signed)
HPI:                                                                Cathy KanarisMichelle D Oconnell is a 52 y.o. female who [facility] today for excessive daytime sleepiness  HPI Reports excessive daytime sleepiness and daily episodes of spontaneously falling asleep, 2-5 times per day. States this began approximately 8 years ago and is gradually worsening. Falls asleep watching television, playing a game on her phone, and at work Education [employer], community(accountant). She denies cataplexy, syncope, bowel/bladder incontinence. She is easily arousable. She takes 4 mg of Dilaudid three times per day and there has been no recent change in this regimen. She does not drive Daughter reports loud snoring, husband denies pauses/apneas Typically sleeps upright in a recliner due to back pain  Usual bedtime is 12 am, takes 1 hour to fall asleep, usually takes Trazodone 100 mg, no nighttime awakenings, wakes at 8am, does not typically feel rested. Does not nap.   [facility] 05/10/2017 07/09/2016 06/11/2016 05/14/2016 05/14/2016  Decreased Interest 1 0 2 1 1   Down, Depressed, Hopeless 1 1 2 2 2   PHQ - 2 Score 2 1 4 3 3   Altered sleeping 3 3 2 2 2   Tired, decreased energy 3 1 2 2 2   Change in appetite 3 1 1 1 1   Feeling bad or failure about yourself  3 1 2 2 2   Trouble concentrating 2 0 1 0 0  Moving slowly or fidgety/restless 3 0 1 1 1   Suicidal thoughts 3 0 1 1 1   PHQ-9 Score 22 7 14 12 12     GAD 7 : Generalized Anxiety Score 05/10/2017 07/09/2016  Nervous, Anxious, on Edge 3 2  Control/stop worrying 3 1  Worry too much - different things 3 1  Trouble relaxing 3 2  Restless 2 1  Easily annoyed or irritable 2 1  Afraid - awful might happen 3 2  Total GAD 7 Score 19 10      Past Medical History:  Diagnosis Date  . Anxiety   . Asthma   . Chronic back pain   . Depression   . Epidermoid cyst of skin of chest 05/14/2016  . History of drug-induced  anaphylaxis 12/16/2016   aspirin  . Migraine headache   . Obesity   . Ovarian cyst, left 10/21/2016   2.3 cm left ovarian cystic lesion with indeterminate but probably benign characteristics. Recommend continued followup by transvaginal pelvic ultrasound in 6-12 weeks   Past Surgical History:  Procedure Laterality Date  . BACK SURGERY    . CESAREAN SECTION    . FOOT SURGERY Left    Social History   Tobacco Use  . Smoking status: Never Smoker  . Smokeless tobacco: Never Used  Substance Use Topics  . Alcohol use: Yes    Alcohol/week: 0.6 oz    Types: 1 Glasses of wine per week   family history includes Breast cancer in her maternal aunt and mother.    ROS: negative except as noted in the HPI  Medications: Current Outpatient Medications  Medication Sig Dispense Refill  . albuterol (PROVENTIL HFA;VENTOLIN HFA) 108 (90 Base) MCG/ACT inhaler Inhale 1-2 puffs into the lungs every 4 (four)  hours as needed for wheezing or shortness of breath. 2 Inhaler 2  . atenolol (TENORMIN) 25 MG tablet Take 2 tablets (50 mg total) by mouth daily. 60 tablet 5  . budesonide (PULMICORT) 180 MCG/ACT inhaler Inhale 1 puff into the lungs 2 (two) times daily. 1 each 3  . buPROPion (WELLBUTRIN XL) 150 MG 24 hr tablet Take 2 tablets (300 mg total) by mouth daily. 180 tablet 0  . desonide (DESOWEN) 0.05 % cream Apply topically 2 (two) times daily. 60 g 0  . EPINEPHrine 0.3 mg/0.3 mL IJ SOAJ injection INJECT AS DIRECTED ONCE AS NEEDED  1  . FLUoxetine (PROZAC) 20 MG tablet Take 1 tablet (20 mg total) by mouth daily. 90 tablet 1  . HYDROmorphone (DILAUDID) 4 MG tablet     . hydrOXYzine (ATARAX/VISTARIL) 10 MG tablet Take 1-5 tablets (10-50 mg total) by mouth 3 (three) times daily as needed for itching. 90 tablet 3  . ipratropium (ATROVENT) 0.06 % nasal spray Place 2 sprays into both nostrils 4 (four) times daily as needed. 15 mL 0  . rizatriptan (MAXALT-MLT) 10 MG disintegrating tablet Take 1 tablet (10 mg  total) by mouth as needed for migraine. May repeat in 2 hours if needed 18 tablet 1  . traZODone (DESYREL) 100 MG tablet Take 1 tablet (100 mg total) by mouth at bedtime. 90 tablet 1   No current facility-administered medications for this visit.    Allergies  Allergen Reactions  . Aspirin Anaphylaxis       Objective:  BP (!) 144/92   Pulse 75   Wt 240 lb (108.9 kg)   BMI 42.51 kg/m  Gen: well-groomed, not ill-appearing, no acute distress, obese female HEENT: head normocephalic, atraumatic; conjunctiva and cornea clear, oropharynx and tongue with white patches, moist mucus membranes; neck supple, no meningeal signs Pulm: Normal work of breathing, normal phonation, clear to auscultation bilaterally CV: Normal rate, regular rhythm, s1 and s2 distinct, no murmurs, clicks or rubs Neuro:  cranial nerves II-XII intact, no nystagmus, normal finger-to-nose, normal heel-to-shin, negative pronator drift, normal rapid alternating movements, DTR's intact, normal tone, no tremor MSK: strength 5/5 and symmetric in bilateral upper and lower extremities, normal gait and station, negative Romberg Mental Status: alert and oriented x 3, speech articulate, and thought processes clear and goal-directed      No results found for this or any previous visit (from the past 72 hour(s)). No results found.    Assessment and Plan: 52 y.o. female with   1. Excessive daytime sleepiness - Epworth sleepiness scale 20 - STOPBANG 4 points - CNS depressants include Trazodone, Hydroxyzine, Hydromorphone. Patient reports symptoms began years before taking these medications - Home sleep test - will assess for OSA or central sleep apnea. Patient prefers home sleep study over lab. If sleep study normal, will refer to neuro  2. Habitual snoring - Home sleep test  3. At risk for sleep apnea - Home sleep test  4. 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 Prescription for spacer. Continue to rinse thoroughly after  use - fluconazole (DIFLUCAN) 200 MG tablet; Take 1 tablet (200 mg total) by mouth once for 1 dose.  Dispense: 1 tablet; Refill: 0  5. Pruritus - hydrOXYzine (ATARAX/VISTARIL) 10 MG tablet; Take 1-5 tablets (10-50 mg total) by mouth 3 (three) times daily as needed for itching.  Dispense: 90 tablet; Refill: 3     Patient education and anticipatory guidance given Patient agrees with treatment plan Follow-up as needed if symptoms worsen  or fail to improve  Darlyne Russian PA-C

## 2017-05-11 ENCOUNTER — Encounter: Payer: Self-pay | Admitting: Physician Assistant

## 2017-05-16 ENCOUNTER — Encounter: Payer: Self-pay | Admitting: Physician Assistant

## 2017-05-16 DIAGNOSIS — B370 Candidal stomatitis: Secondary | ICD-10-CM | POA: Insufficient documentation

## 2017-05-17 DIAGNOSIS — M4716 Other spondylosis with myelopathy, lumbar region: Secondary | ICD-10-CM | POA: Diagnosis not present

## 2017-05-17 DIAGNOSIS — Z79891 Long term (current) use of opiate analgesic: Secondary | ICD-10-CM | POA: Diagnosis not present

## 2017-05-17 DIAGNOSIS — G894 Chronic pain syndrome: Secondary | ICD-10-CM | POA: Diagnosis not present

## 2017-05-17 DIAGNOSIS — M5136 Other intervertebral disc degeneration, lumbar region: Secondary | ICD-10-CM | POA: Diagnosis not present

## 2017-05-17 DIAGNOSIS — M791 Myalgia, unspecified site: Secondary | ICD-10-CM | POA: Diagnosis not present

## 2017-05-17 DIAGNOSIS — M5416 Radiculopathy, lumbar region: Secondary | ICD-10-CM | POA: Diagnosis not present

## 2017-05-26 DIAGNOSIS — G43709 Chronic migraine without aura, not intractable, without status migrainosus: Secondary | ICD-10-CM | POA: Diagnosis not present

## 2017-05-26 DIAGNOSIS — F321 Major depressive disorder, single episode, moderate: Secondary | ICD-10-CM | POA: Diagnosis not present

## 2017-05-27 ENCOUNTER — Other Ambulatory Visit: Payer: Self-pay | Admitting: Physician Assistant

## 2017-05-27 ENCOUNTER — Encounter: Payer: Self-pay | Admitting: Physician Assistant

## 2017-05-27 ENCOUNTER — Ambulatory Visit: Payer: Self-pay | Admitting: Physician Assistant

## 2017-05-27 DIAGNOSIS — Z13220 Encounter for screening for lipoid disorders: Secondary | ICD-10-CM

## 2017-05-27 DIAGNOSIS — Z1231 Encounter for screening mammogram for malignant neoplasm of breast: Secondary | ICD-10-CM

## 2017-05-27 DIAGNOSIS — Z131 Encounter for screening for diabetes mellitus: Secondary | ICD-10-CM

## 2017-05-27 DIAGNOSIS — Z130 Encounter for screening for diseases of the blood and blood-forming organs and certain disorders involving the immune mechanism: Secondary | ICD-10-CM

## 2017-05-27 NOTE — Progress Notes (Signed)
[facility] patient's genetic testing from 04/12/2017 There is no documentation or results in Care Everywhere Office staff stated they would route a message to the counselor and contact the patient directly.  Levonne Hubertharley E. Khilynn Borntreger PA-C

## 2017-06-02 ENCOUNTER — Ambulatory Visit (INDEPENDENT_AMBULATORY_CARE_PROVIDER_SITE_OTHER): Payer: BLUE CROSS/BLUE SHIELD

## 2017-06-02 ENCOUNTER — Encounter: Payer: Self-pay | Admitting: Sports Medicine

## 2017-06-02 ENCOUNTER — Ambulatory Visit (INDEPENDENT_AMBULATORY_CARE_PROVIDER_SITE_OTHER): Payer: BLUE CROSS/BLUE SHIELD | Admitting: Sports Medicine

## 2017-06-02 DIAGNOSIS — Z1231 Encounter for screening mammogram for malignant neoplasm of breast: Secondary | ICD-10-CM

## 2017-06-02 DIAGNOSIS — L910 Hypertrophic scar: Secondary | ICD-10-CM

## 2017-06-02 NOTE — Progress Notes (Signed)
Subjective:    CC: Scar revision  HPI: Almost one year ago I did a excision of a sebaceous cyst on the right anterior chest, she did well but unfortunately did develop a hypertrophic scar at the location.  The scar does bother her and she would like me to decrease its visibility.  Symptoms are moderate, persistent.  I reviewed the past medical history, family history, social history, surgical history, and allergies today and no changes were needed.  Please see the problem list section below in epic for further details.  Past Medical History: Past Medical History:  Diagnosis Date  . Anxiety   . Asthma   . Chronic back pain   . Depression   . Epidermoid cyst of skin of chest 05/14/2016  . 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 Migraine headache   . Obesity   . Ovarian cyst, left 10/21/2016   2.3 cm left ovarian cystic lesion with indeterminate but probably benign characteristics. Recommend continued followup by transvaginal pelvic ultrasound in 6-12 weeks   Past Surgical History: Past Surgical History:  Procedure Laterality Date  . BACK SURGERY    . CESAREAN SECTION    . FOOT SURGERY Left    Social History: Social History   Socioeconomic History  . Marital status: Single    Spouse name: Not on file  . Number of children: Not on file  . Years of education: Not on file  . Highest education level: Not on file  Occupational History  . Not on file  Social Needs  . Financial resource strain: Not on file  . Food insecurity:    Worry: Not on file    Inability: Not on file  . Transportation needs:    Medical: Not on file    Non-medical: Not on file  Tobacco Use  . Smoking status: Never Smoker  . Smokeless tobacco: Never Used  Substance and Sexual Activity  . Alcohol use: Yes    Alcohol/week: 0.6 oz    Types: 1 Glasses of wine per week  . Drug use: No  . Sexual activity: Yes  Lifestyle  . Physical activity:    Days per week: Not on file   Minutes per session: Not on file  . Stress: Not on file  Relationships  . Social connections:    Talks on phone: Not on file    Gets together: Not on file    Attends religious service: Not on file    Active member of club or organization: Not on file    Attends meetings of clubs or organizations: Not on file    Relationship status: Not on file  Other Topics Concern  . Not on file  Social History Narrative  . Not on file   Family History: Family History  Problem Relation Age of Onset  . Breast cancer Mother   . Breast cancer Maternal Aunt    Allergies: Allergies  Allergen Reactions  . Aspirin Anaphylaxis   Medications: See med rec.  Review of Systems: No fevers, chills, night sweats, weight loss, chest pain, or shortness of breath.   Objective:    General: Well Developed, well nourished, and in no acute distress.  Neuro: Alert and oriented x3, extra-ocular muscles intact, sensation grossly intact.  HEENT: Normocephalic, atraumatic, pupils equal round reactive to light, neck supple, no masses, no lymphadenopathy, thyroid nonpalpable.  Skin: Warm and dry, no rashes.  2 cm hypertrophic scar on the right anterior chest. Cardiac: Regular rate and rhythm,  no murmurs rubs or gallops, no lower extremity edema.  Respiratory: Clear to auscultation bilaterally. Not using accessory muscles, speaking in full sentences.  Procedure: Intralesional steroid injection of right sided upper chest hypertrophic scar Consent obtained and verified. Time-out conducted. Noted no overlying erythema, induration, or other signs of local infection. Skin prepped in a sterile fashion. Topical analgesic spray: Ethyl chloride. Completed without difficulty. Meds: In a fanlike pattern I used a 22-gauge needle to inject 1/2 cc lidocaine, 1 cc kenalog 40 into the hypertrophic scar. Advised to call if fevers/chills, erythema, induration, drainage, or persistent bleeding.  Impression and Recommendations:     Hypertrophic scar of skin Hypertrophic scar at the site of a prior sebaceous cyst excision. Intralesional triamcinolone injection today, we took a picture of it. Return in 1 month to compare. ___________________________________________ Ihor Austinhomas J. Benjamin Stainhekkekandam, M.D., ABFM., CAQSM. Primary Care and Sports Medicine [facility]  Adjunct Instructor of Family Medicine  [employer]

## 2017-06-02 NOTE — Patient Instructions (Signed)
Scar Revision A scar is your body's way of healing an injury. Scars may be caused by surgery, cuts, scrapes, burns, or a skin disease such as acne. Scars can change the appearance of your skin if they are wide, raised, sunken, or discolored. A scar near your eye or lip may change the appearance of your face. A scar may also cause your skin to tighten, which can restrict movement. Scar revision is a procedure to reduce, remove, or improve a scar. What are the different techniques for scar revision? Several techniques can be used for scar revision. Your health care provider may use more than one technique. The technique that is used will depend on the size, location, and type of scar you have. Some techniques require anesthesia. You may be given one or more of the following:  A medicine to help you relax (sedative).  A medicine to numb the area (local anesthetic).  A medicine to make you fall asleep (general anesthetic).  Common techniques for scar revision include injections, surface techniques, and surgical scar removal. Injections  A protein solution (collagen) may be used to fill sunken scars. The results may last several months or a few years.  A steroid injection may be used to shrink raised scars.  Surface Techniques These techniques reduce scars by removing the top layers of scar tissue. Options include:  Dermabrasion. This involves a mechanical sanding away of scar tissue.  Laser resurfacing. Focused light is used to remove scar tissue.  Chemical peel. A chemical is used to remove scar tissue.  Bleaching. A chemical is used to lighten scar tissue that is dark or red.  Surgical Scar Removal These procedures involve cutting out the scar and closing the skin to create a smaller, less visible scar. Types include:  Scar excision. This involves removing the scar and closing the skin in layers. The skin is closed using fine stitches (sutures), glue, or tape.  Scar excision with a  skin flap. The scar is removed, and the skin is closed using a flap of your skin. Normal skin near the scar is lifted and moved into the area where the scar was removed.  Scar excision with a skin graft. The scar is removed, and a skin graft is used to close the area of removal. The graft may come from another area of your skin, a donor, or a skin substitute.  Tissue expansion. This involves placing a balloon under or near the scar. The balloon is gradually blown up to stretch the skin. After your skin is stretched, your scar can be removed, and the stretched skin can be used to close the incision.  What are the risks and benefits? The main benefit of scar revision is a better look for your skin. You also may have freer movement if you had a tight scar. Risks of the procedure may include:  Bleeding.  Bleeding under the skin (hematoma).  Infection.  Allergic reactions to medicines.  Numbness.  Pain.  Damage to nerves or blood vessels.  Changes in skin color.  A need for more revision.  Your scar may not be completely gone after a revision procedure. Talk with your health care provider about what to expect. How should I take care of my scar after the procedure? Follow all instructions from your health care provider. These may vary depending on the type of procedure that you had. Be aware that it is normal to have some swelling, discomfort, and redness for a week or two. Healing may   take several weeks or months. Full healing may take up to a year. Follow these general instructions after any type of scar revision:  Do not drive for 24 hours if you received a sedative.  Do not take baths, swim, or use a hot tub until your health care provider approves.  If directed, apply a cold compress to the scar area to relieve swelling, itching, or discomfort.  Return to your normal activities as told by your health care provider. Ask your health care provider what activities are safe for  you.  Avoid activities that stretch the skin area where your scar was treated.  Avoid exposing the scar revision area to the sun. Ask your health care provider if you should use a sunscreen lotion.  Do not use any tobacco products, such as cigarettes, chewing tobacco, and e-cigarettes. They can slow healing. If you need help quitting, ask your health care provider.  Use over-the-counter and prescription medicines, creams, or ointments only as told by your health care provider.  Keep all follow-up visits as told by your health care provider. This is important.  After a surgical scar removal, you may be given these additional instructions:  Keep the bandage (dressing) dry until your health care provider says it can be removed.  Wash your hands with soap and water before you change your dressing. If soap and water are not available, use hand sanitizer.  Change your dressing as told by your health care provider.  Leave stitches (sutures), skin glue, or adhesive strips in place. These skin closures may need to stay in place for 2 weeks or longer. If adhesive strip edges start to loosen and curl up, you may trim the loose edges. Do not remove adhesive strips completely unless your health care provider tells you to do that.  Check your incision area every day for signs of infection. Check for: ? More redness, swelling, or pain. ? More fluid or blood. ? Warmth. ? Pus or a bad smell.  When should I seek medical care? Seek medical care if:  You have a fever.  You have signs of infection.  You have bleeding or fresh bruising.  You have numbness.  You have increasing pain.  This information is not intended to replace advice given to you by your health care provider. Make sure you discuss any questions you have with your health care provider. Document Released: 06/03/2015 Document Revised: 07/18/2015 Document Reviewed: 02/24/2015 Elsevier Interactive Patient Education  2018 Elsevier  Inc.  

## 2017-06-02 NOTE — Assessment & Plan Note (Signed)
Hypertrophic scar at the site of a prior sebaceous cyst excision. Intralesional triamcinolone injection today, we took a picture of it. Return in 1 month to compare.

## 2017-06-03 ENCOUNTER — Encounter: Payer: Self-pay | Admitting: Physician Assistant

## 2017-06-03 ENCOUNTER — Other Ambulatory Visit: Payer: Self-pay | Admitting: Physician Assistant

## 2017-06-03 DIAGNOSIS — F331 Major depressive disorder, recurrent, moderate: Secondary | ICD-10-CM

## 2017-06-03 NOTE — Progress Notes (Signed)
Good afternoon [name],  Your mammogram was negative. Recommend repeat screening in 1 year  [place], [place]

## 2017-06-04 ENCOUNTER — Other Ambulatory Visit: Payer: Self-pay | Admitting: Physician Assistant

## 2017-06-04 MED ORDER — BUPROPION HCL ER (XL) 150 MG PO TB24: 300.0000 mg | ORAL_TABLET | Freq: Every day | ORAL | 0 refills | Status: DC

## 2017-06-18 ENCOUNTER — Ambulatory Visit (HOSPITAL_BASED_OUTPATIENT_CLINIC_OR_DEPARTMENT_OTHER): Payer: BLUE CROSS/BLUE SHIELD | Attending: Physician Assistant | Admitting: Internal Medicine

## 2017-06-18 VITALS — Ht 63.0 in | Wt 238.8 lb

## 2017-06-18 DIAGNOSIS — G4719 Other hypersomnia: Secondary | ICD-10-CM

## 2017-06-18 DIAGNOSIS — R0683 Snoring: Secondary | ICD-10-CM

## 2017-06-21 ENCOUNTER — Ambulatory Visit (INDEPENDENT_AMBULATORY_CARE_PROVIDER_SITE_OTHER): Payer: BLUE CROSS/BLUE SHIELD | Admitting: Physician Assistant

## 2017-06-21 ENCOUNTER — Encounter: Payer: Self-pay | Admitting: Physician Assistant

## 2017-06-21 VITALS — BP 152/75 | HR 73 | Wt 238.0 lb

## 2017-06-21 DIAGNOSIS — I10 Essential (primary) hypertension: Secondary | ICD-10-CM | POA: Diagnosis not present

## 2017-06-21 DIAGNOSIS — Z0000 Encounter for general adult medical examination without abnormal findings: Secondary | ICD-10-CM | POA: Diagnosis not present

## 2017-06-21 DIAGNOSIS — Z6841 Body Mass Index (BMI) 40.0 and over, adult: Secondary | ICD-10-CM | POA: Diagnosis not present

## 2017-06-21 NOTE — Patient Instructions (Addendum)
For your blood pressure: - Goal <130/80 - monitor and log blood pressures at home - check around the same time each day in a relaxed setting - Limit salt to <2000 mg/day - Follow DASH eating plan - limit alcohol to 2 standard drinks per day for men and 1 per day for women - avoid tobacco products - weight loss: 7% of current body weight - follow-up in 4 weeks for nurse BP check. Bring your home readings and cuff  For weight loss: 1800 calorie moderate protein diet - 30-35% calories from protein - 30% calories from fat - 35-40% from carbs: If diabetic, limit carbs to 30 g per meal and 15 g per snack - MEASURE your calories (MyFitnessPal, LoseIt app of some sort) - 8-11 glasses of water per day - limit caffeine to 1 unsweetened beverage per day - avoid fried foods, saturated fat, and heavily processed foods - keep sugar as low as possible  - follow DASH or Mediterranean diets for recipes - avoid skipping meals. Eat 3 regular meals per day with 1-2 snacks (stay within your calorie goal) OR graze every 4 hours. The important thing is to stay on a schedule - avoid eating within 2 hours of bedtime

## 2017-06-21 NOTE — Progress Notes (Signed)
HPI:                                                                Cathy Oconnell is a 52 y.o. female who presents to [place]: Primary Care Sports Medicine today for annual physical exam  Current concerns: none  Past Medical History:  Diagnosis Date  . Anxiety   . Asthma   . Chronic back pain   . Depression   . Epidermoid cyst of skin of chest 05/14/2016  . 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 Migraine headache   . Obesity   . Ovarian cyst, left 10/21/2016   2.3 cm left ovarian cystic lesion with indeterminate but probably benign characteristics. Recommend continued followup by transvaginal pelvic ultrasound in 6-12 weeks   Past Surgical History:  Procedure Laterality Date  . BACK SURGERY    . CESAREAN SECTION    . FOOT SURGERY Left    Social History   Tobacco Use  . Smoking status: Never Smoker  . Smokeless tobacco: Never Used  Substance Use Topics  . Alcohol use: Yes    Alcohol/week: 0.6 oz    Types: 1 Glasses of wine per week   family history includes Breast cancer in her maternal aunt and mother.    ROS: negative except as noted in the HPI  Medications: Current Outpatient Medications  Medication Sig Dispense Refill  . albuterol (PROVENTIL HFA;VENTOLIN HFA) 108 (90 Base) MCG/ACT inhaler Inhale 1-2 puffs into the lungs every 4 (four) hours as needed for wheezing or shortness of breath. 2 Inhaler 2  . atenolol (TENORMIN) 25 MG tablet Take 2 tablets (50 mg total) by mouth daily. 60 tablet 5  . baclofen (LIORESAL) 10 MG tablet Take one tablet (10 mg dose) by mouth 3 (three) times a day as needed.  2  . budesonide (PULMICORT) 180 MCG/ACT inhaler Inhale 1 puff into the lungs 2 (two) times daily. 1 each 3  . buPROPion (WELLBUTRIN XL) 150 MG 24 hr tablet Take 2 tablets (300 mg total) by mouth daily. 180 tablet 0  . desonide (DESOWEN) 0.05 % cream Apply topically 2 (two) times daily. 60 g 0  . EPINEPHrine 0.3 mg/0.3 mL  IJ SOAJ injection INJECT AS DIRECTED ONCE AS NEEDED  1  . FLUoxetine (PROZAC) 20 MG tablet Take 1 tablet (20 mg total) by mouth daily. 90 tablet 1  . Galcanezumab-gnlm 120 MG/ML SOAJ Inject into the skin.    Marland Kitchen HYDROmorphone (DILAUDID) 4 MG tablet     . hydrOXYzine (ATARAX/VISTARIL) 10 MG tablet Take 1-5 tablets (10-50 mg total) by mouth 3 (three) times daily as needed for itching. 90 tablet 3  . rizatriptan (MAXALT-MLT) 10 MG disintegrating tablet Take 1 tablet (10 mg total) by mouth as needed for migraine. May repeat in 2 hours if needed 18 tablet 1  . traZODone (DESYREL) 100 MG tablet Take 1 tablet (100 mg total) by mouth at bedtime. 90 tablet 1   No current facility-administered medications for this visit.    Allergies  Allergen Reactions  . Aspirin Anaphylaxis       Objective:  BP (!) 145/86   Pulse 73   Wt 238 lb (108 kg)   BMI 42.16 kg/m  General Appearance:  Alert, cooperative, no distress, appropriate for age, obese female                            Head:  Normocephalic, without obvious abnormality                             Eyes:  PERRL, EOM's intact, conjunctiva and cornea clear                             Ears:  TM pearly gray color and semitransparent, external ear canals normal, both ears                            Nose:  Nares symmetrical                          Throat:  Lips, tongue, and mucosa are moist, pink, and intact; poor dentition                             Neck:  Supple; symmetrical, trachea midline, no adenopathy; thyroid: no enlargement, symmetric, no tenderness/mass/nodules                             Back:  Symmetrical, no curvature, ROM normal               Chest/Breast:  deferred                           Lungs:  Clear to auscultation bilaterally, respirations unlabored                             Heart:  regular rate & normal rhythm, S1 and S2 normal, no murmurs, rubs, or gallops                     Abdomen:  Soft, non-tender, no mass or  organomegaly              Genitourinary:  deferred         Musculoskeletal:  Tone and strength strong and symmetrical, all extremities; no joint pain or edema, normal gait and station                                   Lymphatic:  No adenopathy             Skin/Hair/Nails:  Skin warm, dry and intact, no rashes or abnormal dyspigmentation on limited exam                   Neurologic:  Alert and oriented x3, no cranial nerve deficits, DTR's intact, sensation grossly intact, normal gait and station, no tremor Psych: well-groomed, cooperative, good eye contact, euthymic mood, affect mood-congruent, speech is articulate, and thought processes clear and goal-directed     No results found for this or any previous visit (from the past 72 hour(s)). No results found.    Assessment and Plan: 52 y.o. female with   1. Annual physical exam - fasting labs pending - Cologuard  ordered >1 month ago, patient has not returned the kit. Declines colonoscopy - Pap and mammo UTD  2. Class 3 severe obesity due to excess calories without serious comorbidity with body mass index (BMI) of 40.0 to 44.9 in adult [facility]) - counseled on weight loss through decreased caloric intake and increased aerobic exercise - recommended 1800 calorie moderate protein diet  3. Hypertension goal BP (blood pressure) < 130/80 BP Readings from Last 3 Encounters:  06/21/17 (!) 152/75  06/02/17 (!) 144/83  05/10/17 (!) 144/92  - patient to monitor BP at home. CVD risk factors include obesity. Counseled on therapeutic lifestyle changes. Follow-up in 4 weeks. If home readings >130/80, will start antihypertensive   Patient education and anticipatory guidance given Patient agrees with treatment plan Follow-up in 1 year for CPE w/fasting labs or sooner as needed if symptoms worsen or fail to improve  Darlyne Russian PA-C

## 2017-06-22 ENCOUNTER — Encounter: Payer: Self-pay | Admitting: Physician Assistant

## 2017-06-30 ENCOUNTER — Ambulatory Visit: Payer: BLUE CROSS/BLUE SHIELD | Admitting: Sports Medicine

## 2017-07-09 ENCOUNTER — Encounter: Payer: Self-pay | Admitting: Physician Assistant

## 2017-07-14 DIAGNOSIS — G894 Chronic pain syndrome: Secondary | ICD-10-CM | POA: Diagnosis not present

## 2017-07-14 DIAGNOSIS — M4716 Other spondylosis with myelopathy, lumbar region: Secondary | ICD-10-CM | POA: Diagnosis not present

## 2017-07-14 DIAGNOSIS — M5416 Radiculopathy, lumbar region: Secondary | ICD-10-CM | POA: Diagnosis not present

## 2017-07-14 DIAGNOSIS — Z79891 Long term (current) use of opiate analgesic: Secondary | ICD-10-CM | POA: Diagnosis not present

## 2017-07-14 DIAGNOSIS — M5136 Other intervertebral disc degeneration, lumbar region: Secondary | ICD-10-CM | POA: Diagnosis not present

## 2017-07-20 ENCOUNTER — Ambulatory Visit: Payer: BLUE CROSS/BLUE SHIELD

## 2017-07-21 ENCOUNTER — Encounter: Payer: Self-pay | Admitting: Physician Assistant

## 2017-07-21 DIAGNOSIS — L299 Pruritus, unspecified: Secondary | ICD-10-CM

## 2017-07-21 MED ORDER — HYDROXYZINE HCL 10 MG PO TABS: 10.0000 mg | ORAL_TABLET | Freq: Three times a day (TID) | ORAL | 0 refills | Status: DC | PRN

## 2017-08-17 ENCOUNTER — Other Ambulatory Visit: Payer: Self-pay | Admitting: Physician Assistant

## 2017-08-17 DIAGNOSIS — L299 Pruritus, unspecified: Secondary | ICD-10-CM

## 2017-08-23 ENCOUNTER — Encounter: Payer: Self-pay | Admitting: Physician Assistant

## 2017-08-23 DIAGNOSIS — F410 Panic disorder [episodic paroxysmal anxiety] without agoraphobia: Secondary | ICD-10-CM

## 2017-08-23 DIAGNOSIS — F331 Major depressive disorder, recurrent, moderate: Secondary | ICD-10-CM

## 2017-08-24 MED ORDER — FLUOXETINE HCL 20 MG PO TABS: 40.0000 mg | ORAL_TABLET | Freq: Every day | ORAL | 1 refills | Status: DC

## 2017-08-24 MED ORDER — BUPROPION HCL ER (XL) 150 MG PO TB24: 150.0000 mg | ORAL_TABLET | Freq: Every day | ORAL | 0 refills | Status: DC

## 2017-09-05 ENCOUNTER — Encounter: Payer: Self-pay | Admitting: Physician Assistant

## 2017-09-09 DIAGNOSIS — M5416 Radiculopathy, lumbar region: Secondary | ICD-10-CM | POA: Diagnosis not present

## 2017-09-09 DIAGNOSIS — Z79891 Long term (current) use of opiate analgesic: Secondary | ICD-10-CM | POA: Diagnosis not present

## 2017-09-09 DIAGNOSIS — M4716 Other spondylosis with myelopathy, lumbar region: Secondary | ICD-10-CM | POA: Diagnosis not present

## 2017-09-09 DIAGNOSIS — M5136 Other intervertebral disc degeneration, lumbar region: Secondary | ICD-10-CM | POA: Diagnosis not present

## 2017-09-09 DIAGNOSIS — G894 Chronic pain syndrome: Secondary | ICD-10-CM | POA: Diagnosis not present

## 2017-09-14 ENCOUNTER — Other Ambulatory Visit: Payer: Self-pay | Admitting: Physician Assistant

## 2017-09-14 DIAGNOSIS — L299 Pruritus, unspecified: Secondary | ICD-10-CM

## 2017-09-15 ENCOUNTER — Other Ambulatory Visit: Payer: Self-pay | Admitting: Physician Assistant

## 2017-09-15 DIAGNOSIS — L299 Pruritus, unspecified: Secondary | ICD-10-CM

## 2017-10-18 ENCOUNTER — Other Ambulatory Visit: Payer: Self-pay | Admitting: Physician Assistant

## 2017-10-18 DIAGNOSIS — L299 Pruritus, unspecified: Secondary | ICD-10-CM

## 2017-11-12 ENCOUNTER — Other Ambulatory Visit: Payer: Self-pay | Admitting: Physician Assistant

## 2017-11-12 ENCOUNTER — Encounter: Payer: Self-pay | Admitting: Physician Assistant

## 2017-11-12 DIAGNOSIS — L509 Urticaria, unspecified: Secondary | ICD-10-CM

## 2017-11-12 MED ORDER — LEVOCETIRIZINE DIHYDROCHLORIDE 5 MG PO TABS: 5.0000 mg | ORAL_TABLET | Freq: Every day | ORAL | 5 refills | Status: AC

## 2017-11-12 MED ORDER — MONTELUKAST SODIUM 10 MG PO TABS: 10.0000 mg | ORAL_TABLET | Freq: Every day | ORAL | 5 refills | Status: DC

## 2017-11-12 MED ORDER — HYDROXYZINE HCL 25 MG PO TABS: 25.0000 mg | ORAL_TABLET | Freq: Every evening | ORAL | 0 refills | Status: DC | PRN

## 2017-11-17 ENCOUNTER — Other Ambulatory Visit: Payer: Self-pay | Admitting: Physician Assistant

## 2017-11-17 DIAGNOSIS — M5416 Radiculopathy, lumbar region: Secondary | ICD-10-CM | POA: Diagnosis not present

## 2017-11-17 DIAGNOSIS — M5136 Other intervertebral disc degeneration, lumbar region: Secondary | ICD-10-CM | POA: Diagnosis not present

## 2017-11-17 DIAGNOSIS — Z79891 Long term (current) use of opiate analgesic: Secondary | ICD-10-CM | POA: Diagnosis not present

## 2017-11-17 DIAGNOSIS — L299 Pruritus, unspecified: Secondary | ICD-10-CM

## 2017-11-17 DIAGNOSIS — G894 Chronic pain syndrome: Secondary | ICD-10-CM | POA: Diagnosis not present

## 2017-11-17 DIAGNOSIS — M4716 Other spondylosis with myelopathy, lumbar region: Secondary | ICD-10-CM | POA: Diagnosis not present

## 2017-11-22 ENCOUNTER — Encounter: Payer: Self-pay | Admitting: Physician Assistant

## 2017-11-22 DIAGNOSIS — J4531 Mild persistent asthma with (acute) exacerbation: Secondary | ICD-10-CM

## 2017-11-22 MED ORDER — ALBUTEROL SULFATE HFA 108 (90 BASE) MCG/ACT IN AERS: 1.0000 | INHALATION_SPRAY | RESPIRATORY_TRACT | 0 refills | Status: DC | PRN

## 2017-11-23 ENCOUNTER — Ambulatory Visit (INDEPENDENT_AMBULATORY_CARE_PROVIDER_SITE_OTHER): Payer: BLUE CROSS/BLUE SHIELD | Admitting: Physician Assistant

## 2017-11-23 ENCOUNTER — Encounter: Payer: Self-pay | Admitting: Physician Assistant

## 2017-11-23 VITALS — BP 142/80 | HR 80 | Temp 98.0°F | Wt 248.0 lb

## 2017-11-23 DIAGNOSIS — H6122 Impacted cerumen, left ear: Secondary | ICD-10-CM | POA: Diagnosis not present

## 2017-11-23 NOTE — Patient Instructions (Addendum)
Okay to use Debrox drops to prevent buildup AVOID Q-tips  Earwax Buildup, Adult The ears produce a substance called earwax that helps keep bacteria out of the ear and protects the skin in the ear canal. Occasionally, earwax can build up in the ear and cause discomfort or hearing loss. What increases the risk? This condition is more likely to develop in people who:  Are female.  Are elderly.  Naturally produce more earwax.  Clean their ears often with cotton swabs.  Use earplugs often.  Use in-ear headphones often.  Wear hearing aids.  Have narrow ear canals.  Have earwax that is overly thick or sticky.  Have eczema.  Are dehydrated.  Have excess hair in the ear canal.  What are the signs or symptoms? Symptoms of this condition include:  Reduced or muffled hearing.  A feeling of fullness in the ear or feeling that the ear is plugged.  Fluid coming from the ear.  Ear pain.  Ear itch.  Ringing in the ear.  Coughing.  An obvious piece of earwax that can be seen inside the ear canal.  How is this diagnosed? This condition may be diagnosed based on:  Your symptoms.  Your medical history.  An ear exam. During the exam, your health care provider will look into your ear with an instrument called an otoscope.  You may have tests, including a hearing test. How is this treated? This condition may be treated by:  Using ear drops to soften the earwax.  Having the earwax removed by a health care provider. The health care provider may: ? Flush the ear with water. ? Use an instrument that has a loop on the end (curette). ? Use a suction device.  Surgery to remove the wax buildup. This may be done in severe cases.  Follow these instructions at home:  Take over-the-counter and prescription medicines only as told by your health care provider.  Do not put any objects, including cotton swabs, into your ear. You can clean the opening of your ear canal with a  washcloth or facial tissue.  Follow instructions from your health care provider about cleaning your ears. Do not over-clean your ears.  Drink enough fluid to keep your urine clear or pale yellow. This will help to thin the earwax.  Keep all follow-up visits as told by your health care provider. If earwax builds up in your ears often or if you use hearing aids, consider seeing your health care provider for routine, preventive ear cleanings. Ask your health care provider how often you should schedule your cleanings.  If you have hearing aids, clean them according to instructions from the manufacturer and your health care provider. Contact a health care provider if:  You have ear pain.  You develop a fever.  You have blood, pus, or other fluid coming from your ear.  You have hearing loss.  You have ringing in your ears that does not go away.  Your symptoms do not improve with treatment.  You feel like the room is spinning (vertigo). Summary  Earwax can build up in the ear and cause discomfort or hearing loss.  The most common symptoms of this condition include reduced or muffled hearing and a feeling of fullness in the ear or feeling that the ear is plugged.  This condition may be diagnosed based on your symptoms, your medical history, and an ear exam.  This condition may be treated by using ear drops to soften the earwax or by  having the earwax removed by a health care provider.  Do not put any objects, including cotton swabs, into your ear. You can clean the opening of your ear canal with a washcloth or facial tissue. This information is not intended to replace advice given to you by your health care provider. Make sure you discuss any questions you have with your health care provider. Document Released: 03/19/2004 Document Revised: 04/22/2016 Document Reviewed: 04/22/2016 Elsevier Interactive Patient Education  [employer].

## 2017-11-23 NOTE — Telephone Encounter (Signed)
Called pt and scheduled acute visit for ear pain, and follow up later this week for HTN

## 2017-11-23 NOTE — Progress Notes (Signed)
HPI:                                                                Cathy Oconnell is a 52 y.o. female who presents to [name]: Primary Care Sports Medicine today for otalgia, left  Ear Fullness   There is pain in the left ear. This is a new problem. The current episode started yesterday. The problem occurs constantly. The problem has been unchanged. There has been no fever. The patient is experiencing no pain. Associated symptoms include hearing loss ("muffled"). Pertinent negatives include no rhinorrhea or sore throat. She has tried nothing for the symptoms.    Past Medical History:  Diagnosis Date  . Anxiety   . Asthma   . Chronic back pain   . Depression   . Epidermoid cyst of skin of chest 05/14/2016  . 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 Migraine headache   . Obesity   . Ovarian cyst, left 10/21/2016   2.3 cm left ovarian cystic lesion with indeterminate but probably benign characteristics. Recommend continued followup by transvaginal pelvic ultrasound in 6-12 weeks   Past Surgical History:  Procedure Laterality Date  . BACK SURGERY    . CESAREAN SECTION    . FOOT SURGERY Left    Social History   Tobacco Use  . Smoking status: Never Smoker  . Smokeless tobacco: Never Used  Substance Use Topics  . Alcohol use: Yes    Alcohol/week: 1.0 standard drinks    Types: 1 Glasses of wine per week   family history includes Breast cancer in her maternal aunt and mother.    ROS: negative except as noted in the HPI  Medications: Current Outpatient Medications  Medication Sig Dispense Refill  . albuterol (PROVENTIL HFA;VENTOLIN HFA) 108 (90 Base) MCG/ACT inhaler Inhale 1-2 puffs into the lungs every 4 (four) hours as needed for wheezing or shortness of breath. 2 Inhaler 0  . atenolol (TENORMIN) 25 MG tablet Take 2 tablets (50 mg total) by mouth daily. 60 tablet 5  . baclofen (LIORESAL) 10 MG tablet Take one tablet (10 mg dose)  by mouth 3 (three) times a day as needed.  2  . budesonide (PULMICORT) 180 MCG/ACT inhaler Inhale 1 puff into the lungs 2 (two) times daily. 1 each 3  . buPROPion (WELLBUTRIN XL) 150 MG 24 hr tablet Take 1 tablet (150 mg total) by mouth daily. 180 tablet 0  . desonide (DESOWEN) 0.05 % cream Apply topically 2 (two) times daily. 60 g 0  . EPINEPHrine 0.3 mg/0.3 mL IJ SOAJ injection INJECT AS DIRECTED ONCE AS NEEDED  1  . FLUoxetine (PROZAC) 20 MG tablet Take 2 tablets (40 mg total) by mouth at bedtime. 60 tablet 1  . Galcanezumab-gnlm 120 MG/ML SOAJ Inject into the skin.    Marland Kitchen HYDROmorphone (DILAUDID) 4 MG tablet     . hydrOXYzine (ATARAX/VISTARIL) 25 MG tablet Take 1-2 tablets (25-50 mg total) by mouth at bedtime as needed for itching. 30 tablet 0  . levocetirizine (XYZAL) 5 MG tablet Take 1 tablet (5 mg total) by mouth daily. 30 tablet 5  . montelukast (SINGULAIR) 10 MG tablet Take 1 tablet (10 mg total) by mouth daily. 30 tablet 5  . rizatriptan (  MAXALT-MLT) 10 MG disintegrating tablet Take 1 tablet (10 mg total) by mouth as needed for migraine. May repeat in 2 hours if needed 18 tablet 1  . traZODone (DESYREL) 100 MG tablet Take 1 tablet (100 mg total) by mouth at bedtime. 90 tablet 1   No current facility-administered medications for this visit.    Allergies  Allergen Reactions  . Aspirin Anaphylaxis       Objective:  BP (!) 142/80   Pulse 80   Temp 98 F (36.7 C) (Oral)   Wt 248 lb (112.5 kg)   BMI 43.93 kg/m  Gen:  alert, not ill-appearing, no distress, appropriate for age HEENT: head normocephalic without obvious abnormality, conjunctiva and cornea clear, right TM pearly gray and semi-transparent, left TM fully obstructed by cerumen, no pre- or post-auricular adenopathy, no mastoid tenderness, trachea midline   Procedure: Left ear irrigation Indication: hearing loss due to cerumen impaction Verbal consent obtain Left  ear canal soaked with Colace for 15 minutes Ear  irrigated with peroxide/H2O by Ardell Isaacs, CMA.  Manual debridement was not indicated Patient tolerated the procedure without any complications. Post-irrigation examination shows cerumen was completely removed. TM clear without erythema or perforation.   No results found for this or any previous visit (from the past 72 hour(s)). No results found.    Assessment and Plan: 52 y.o. female with   .Diagnoses and all orders for this visit:  Impacted cerumen of left ear   Ear lavage performed in office today Patient tolerated the procedure without any complications. Post-irrigation examination shows cerumen was completely removed. TM clear without erythema or perforation Debrox drops prn, avoid Qtips   Patient education and anticipatory guidance given Patient agrees with treatment plan Follow-up as needed if symptoms worsen or fail to improve  Levonne Hubert PA-C

## 2017-11-25 ENCOUNTER — Ambulatory Visit: Payer: BLUE CROSS/BLUE SHIELD | Admitting: Physician Assistant

## 2017-12-10 ENCOUNTER — Other Ambulatory Visit: Payer: Self-pay | Admitting: Physician Assistant

## 2017-12-10 DIAGNOSIS — L509 Urticaria, unspecified: Secondary | ICD-10-CM

## 2017-12-10 DIAGNOSIS — J4531 Mild persistent asthma with (acute) exacerbation: Secondary | ICD-10-CM

## 2017-12-22 ENCOUNTER — Other Ambulatory Visit: Payer: Self-pay | Admitting: Physician Assistant

## 2017-12-22 DIAGNOSIS — L509 Urticaria, unspecified: Secondary | ICD-10-CM

## 2018-01-12 DIAGNOSIS — M5416 Radiculopathy, lumbar region: Secondary | ICD-10-CM | POA: Diagnosis not present

## 2018-01-12 DIAGNOSIS — G894 Chronic pain syndrome: Secondary | ICD-10-CM | POA: Diagnosis not present

## 2018-01-12 DIAGNOSIS — Z79891 Long term (current) use of opiate analgesic: Secondary | ICD-10-CM | POA: Diagnosis not present

## 2018-01-12 DIAGNOSIS — M5136 Other intervertebral disc degeneration, lumbar region: Secondary | ICD-10-CM | POA: Diagnosis not present

## 2018-01-12 DIAGNOSIS — M4716 Other spondylosis with myelopathy, lumbar region: Secondary | ICD-10-CM | POA: Diagnosis not present

## 2018-01-12 DIAGNOSIS — M791 Myalgia, unspecified site: Secondary | ICD-10-CM | POA: Diagnosis not present

## 2018-03-10 DIAGNOSIS — M4716 Other spondylosis with myelopathy, lumbar region: Secondary | ICD-10-CM | POA: Diagnosis not present

## 2018-03-10 DIAGNOSIS — G894 Chronic pain syndrome: Secondary | ICD-10-CM | POA: Diagnosis not present

## 2018-03-10 DIAGNOSIS — Z79891 Long term (current) use of opiate analgesic: Secondary | ICD-10-CM | POA: Diagnosis not present

## 2018-03-10 DIAGNOSIS — M5416 Radiculopathy, lumbar region: Secondary | ICD-10-CM | POA: Diagnosis not present

## 2018-03-10 DIAGNOSIS — M5136 Other intervertebral disc degeneration, lumbar region: Secondary | ICD-10-CM | POA: Diagnosis not present

## 2018-04-18 ENCOUNTER — Ambulatory Visit (INDEPENDENT_AMBULATORY_CARE_PROVIDER_SITE_OTHER): Payer: BLUE CROSS/BLUE SHIELD | Admitting: Physician Assistant

## 2018-04-18 ENCOUNTER — Encounter: Payer: Self-pay | Admitting: Physician Assistant

## 2018-04-18 VITALS — BP 143/109 | HR 74 | Temp 97.6°F | Wt 252.0 lb

## 2018-04-18 DIAGNOSIS — I10 Essential (primary) hypertension: Secondary | ICD-10-CM | POA: Diagnosis not present

## 2018-04-18 DIAGNOSIS — S0501XA Injury of conjunctiva and corneal abrasion without foreign body, right eye, initial encounter: Secondary | ICD-10-CM | POA: Diagnosis not present

## 2018-04-18 LAB — COMPLETE METABOLIC PANEL WITH GFR
AG Ratio: 1.3 (calc) (ref 1.0–2.5)
ALBUMIN MSPROF: 4.6 g/dL (ref 3.6–5.1)
ALT: 22 U/L (ref 6–29)
AST: 18 U/L (ref 10–35)
Alkaline phosphatase (APISO): 153 U/L (ref 37–153)
BUN: 7 mg/dL (ref 7–25)
CO2: 24 mmol/L (ref 20–32)
Calcium: 9.5 mg/dL (ref 8.6–10.4)
Chloride: 105 mmol/L (ref 98–110)
Creat: 0.78 mg/dL (ref 0.50–1.05)
GFR, Est African American: 101 mL/min/{1.73_m2} (ref >=60)
GFR, Est Non African American: 87 mL/min/{1.73_m2} (ref >=60)
GLUCOSE: 94 mg/dL (ref 65–99)
Globulin: 3.6 g/dL (calc) (ref 1.9–3.7)
Potassium: 3.8 mmol/L (ref 3.5–5.3)
Sodium: 140 mmol/L (ref 135–146)
Total Bilirubin: 0.4 mg/dL (ref 0.2–1.2)
Total Protein: 8.2 g/dL — ABNORMAL HIGH (ref 6.1–8.1)

## 2018-04-18 MED ORDER — OFLOXACIN 0.3 % OP SOLN: 1.0000 [drp] | Freq: Four times a day (QID) | OPHTHALMIC | 0 refills | Status: AC

## 2018-04-18 MED ORDER — AMLODIPINE BESYLATE 5 MG PO TABS: 5.0000 mg | ORAL_TABLET | Freq: Every day | ORAL | 2 refills | Status: DC

## 2018-04-18 NOTE — Patient Instructions (Addendum)
Use antibiotic eye drops 4 times daily for 3 days Switch to an OTC eye lubricant after 3 days No contact lenses for 1 week or until complete resolution of symptoms Follow-up in office if symptoms worsen or fail to improve  Corneal Abrasion  A corneal abrasion is a scratch or injury to the clear covering over the front of your eye (cornea). Your cornea forms a clear dome that protects your eye and helps to focus your vision. Your cornea is made up of many layers. The surface layer is a single layer of cells (corneal epithelium). It is one of the most sensitive tissues in your body. A corneal abrasion can be very painful. If a corneal abrasion is not treated, it can become infected and cause an ulcer. This can lead to scarring. A scarred cornea can affect your vision. Sometimes abrasions come back in the same area, even after the original injury has healed (recurrent erosion syndrome). What are the causes? This condition may be caused by:  A poke in the eye.  A gritty or irritating substance (foreign body) in the eye.  Excessive eye rubbing.  Very dry eyes.  Certain eye infections.  Contact lenses that fit poorly or are worn for a long period of time. You can also injure your cornea when putting contacts lenses in your eye or taking them out.  Eye surgery. Sometimes, the cause is unknown. What are the signs or symptoms? Symptoms of this condition include:  Eye pain. The pain may get worse when your eye is open or when you move your eye.  A feeling of something stuck in your eye.  Having trouble keeping your eye open, or not being able to keep it open.  Tearing and redness.  Sensitivity to light.  Blurred vision.  Headache. How is this diagnosed? This condition may be diagnosed based on:  Your medical history.  Your symptoms.  An eye exam. You may work with a health care provider who specializes in diseases and conditions of the eye (ophthalmologist). Before the eye  exam, numbing drops may be put into your eye. You may also have dye put in your eye with a dropper or a small paper strip. The dye makes the abrasion easy to see when your ophthalmologist examines your eye with a light. Your ophthalmologist may look at your eye through an eye scope (slit lamp). How is this treated? Treatment may vary depending on the cause of your condition, and it may include:  Washing out your eye.  Removing any foreign body.  Antibiotic drops or ointment to treat an infection.  Steroid drops or ointment to treat redness, irritation, or inflammation.  Pain medicine.  An eye patch to keep your eye closed. Follow these instructions at home: Medicines  Use eye drops or ointments as told by your eye care provider.  If you were prescribed antibiotic drops or ointment, use them as told by your eye care provider. Do not stop using the antibiotic even if you start to feel better.  Take over-the-counter and prescription medicines only as told by your eye care provider.  Do not drive or use heavy machinery while taking prescription pain medicine. General instructions  If you have an eye patch, wear it as told by your eye care provider. ? Do not drive or use machinery while wearing an eye patch. Your ability to judge distances will be impaired. ? Follow instructions from your eye care provider about when to remove the patch.  Ask your  eye care provider whether you can use a cold, wet cloth (compress) on your eye to relieve pain.  Do not rub or touch your eye. Do not wash out your eye.  Do not wear contact lenses until your eye care provider says that this is okay.  Avoid bright light and eye strain.  Keep all follow-up visits as told by your eye care provider. This is important for preventing infection and vision loss. Contact a health care provider if:  You continue to have eye pain and other symptoms for more than 2 days.  You develop new symptoms, such as  redness, tearing, or discharge.  You have discharge that makes your eyelids stick together in the morning.  Your eye patch becomes so loose that you can blink your eye.  Symptoms return after the original abrasion has healed. Get help right away if:  You have severe eye pain that does not get better with medicine.  You have vision loss. Summary  A corneal abrasion is a scratch on the outer layer of the clear covering over the front of your eye (cornea).  Corneal abrasion can cause eye pain, redness, tearing, and blurred vision.  This condition is usually treated with medicine to prevent infection and scarring. You also may have to wear an eye patch to cover your eye.  Let your eye care provider know if your symptoms continue for more than 2 days. This information is not intended to replace advice given to you by your health care provider. Make sure you discuss any questions you have with your health care provider. Document Released: 02/07/2000 Document Revised: 01/21/2016 Document Reviewed: 01/21/2016 Elsevier Interactive Patient Education  2019 [employer].

## 2018-04-18 NOTE — Progress Notes (Addendum)
HPI:                                                                Cathy Oconnell is a 53 y.o. female who presents to [facility] Cathy [facility]: Primary Care Sports Medicine today for right eye redness  Reports grazing her right eye with her eyeliner pencil yesterday. Woke up with right eye redness, eyelid crusting and "gritty" sensation in the eye today. Denies globe pain or pain with eye movement. Admits to blurred vision because she is unable to wear her contact lenses.   Past Medical History:  Diagnosis Date  . Anxiety   . Asthma   . Chronic back pain   . Depression   . Epidermoid cyst of skin of chest 05/14/2016  . 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 Migraine headache   . Obesity   . Ovarian cyst, left 10/21/2016   2.3 cm left ovarian cystic lesion with indeterminate but probably benign characteristics. Recommend continued followup by transvaginal pelvic ultrasound in 6-12 weeks   Past Surgical History:  Procedure Laterality Date  . BACK SURGERY    . CESAREAN SECTION    . FOOT SURGERY Left    Social History   Tobacco Use  . Smoking status: Never Smoker  . Smokeless tobacco: Never Used  Substance Use Topics  . Alcohol use: Yes    Alcohol/week: 1.0 standard drinks    Types: 1 Glasses of wine per week   family history includes Breast cancer in her maternal aunt and mother.    ROS: negative except as noted in the HPI  Medications: Current Outpatient Medications  Medication Sig Dispense Refill  . amLODipine (NORVASC) 5 MG tablet Take 1 tablet (5 mg total) by mouth daily. 30 tablet 2  . atenolol (TENORMIN) 25 MG tablet Take 2 tablets (50 mg total) by mouth daily. 60 tablet 5  . baclofen (LIORESAL) 10 MG tablet Take one tablet (10 mg dose) by mouth 3 (three) times a day as needed.  2  . budesonide (PULMICORT) 180 MCG/ACT inhaler Inhale 1 puff into the lungs 2 (two) times daily. 1 each 3  . buPROPion (WELLBUTRIN XL) 150 MG 24 hr tablet  Take 1 tablet (150 mg total) by mouth daily. 180 tablet 0  . desonide (DESOWEN) 0.05 % cream Apply topically 2 (two) times daily. 60 g 0  . EPINEPHrine 0.3 mg/0.3 mL IJ SOAJ injection INJECT AS DIRECTED ONCE AS NEEDED  1  . FLUoxetine (PROZAC) 20 MG tablet Take 2 tablets (40 mg total) by mouth at bedtime. 60 tablet 1  . Galcanezumab-gnlm 120 MG/ML SOAJ Inject into the skin.    Marland Kitchen HYDROmorphone (DILAUDID) 4 MG tablet     . hydrOXYzine (ATARAX/VISTARIL) 25 MG tablet TAKE 1 TO 2 TABLETS(25 TO 50 MG) BY MOUTH AT BEDTIME AS NEEDED FOR ITCHING 90 tablet 1  . levocetirizine (XYZAL) 5 MG tablet Take 1 tablet (5 mg total) by mouth daily. 30 tablet 5  . montelukast (SINGULAIR) 10 MG tablet Take 1 tablet (10 mg total) by mouth daily. 30 tablet 5  . ofloxacin (OCUFLOX) 0.3 % ophthalmic solution Place 1 drop into the right eye 4 (four) times daily for 3 days. 5 mL 0  . PROAIR HFA 108 (  90 Base) MCG/ACT inhaler INHALE 1 TO 2 PUFFS INTO THE LUNGS EVERY 4 HOURS AS NEEDED FOR WHEEZING OR SHORTNESS OF BREATH 8.5 g 0  . rizatriptan (MAXALT-MLT) 10 MG disintegrating tablet Take 1 tablet (10 mg total) by mouth as needed for migraine. May repeat in 2 hours if needed 18 tablet 1  . traZODone (DESYREL) 100 MG tablet Take 1 tablet (100 mg total) by mouth at bedtime. 90 tablet 1   No current facility-administered medications for this visit.    Allergies  Allergen Reactions  . Aspirin Anaphylaxis       Objective:  BP (!) 143/109   Pulse 74   Temp 97.6 F (36.4 C) (Oral)   Wt 252 lb (114.3 kg)   BMI 44.64 kg/m  Physical Exam Constitutional:      Appearance: Normal appearance. She is not ill-appearing.  Eyes:     General: Lids are normal.     Extraocular Movements:     Right eye: Normal extraocular motion and no nystagmus.     Left eye: Normal extraocular motion and no nystagmus.     Conjunctiva/sclera:     Right eye: Right conjunctiva is injected. No chemosis, exudate or hemorrhage.    Comments: Visual  acuity without correction L 20/25 R 20/200 B/l 20/30  Cardiovascular:     Rate and Rhythm: Normal rate and regular rhythm.  No extrasystoles are present.    Heart sounds: S1 normal and S2 normal. No murmur.  Neurological:     Mental Status: She is alert.  Psychiatric:        Behavior: Behavior is cooperative.       Assessment and Plan: 53 y.o. female with   .Cathy Oconnell was seen today for conjunctivitis.  Diagnoses and all orders for this visit:  Abrasion of right cornea, initial encounter -     ofloxacin (OCUFLOX) 0.3 % ophthalmic solution; Place 1 drop into the right eye 4 (four) times daily for 3 days.  Hypertension goal BP (blood pressure) < 130/80 -     amLODipine (NORVASC) 5 MG tablet; Take 1 tablet (5 mg total) by mouth daily. -     COMPLETE METABOLIC PANEL WITH GFR   Corneal abrasion, right Decreased visual acuity, largely attributed to lack of correction. Admits to very poor vision in her right eye at baseline Use antibiotic eye drops 4 times daily for 3 days Switch to an OTC eye lubricant after 3 days No contact lenses for 1 week or until complete resolution of symptoms Follow-up in office if symptoms worsen or fail to improve  HTN BP in stage 2 hypertensive range on 2 office checks, she is asymptomatic CMP pending Starting Amlodipine Counseled on therapeutic lifestyle changes Monitor and log BP's at home for the next 2 weeks  Patient education and anticipatory guidance given Patient agrees with treatment plan Follow-up in 2 weeks for nurse BP check or sooner as needed if symptoms worsen or fail to improve  Levonne Hubert PA-C

## 2018-04-19 ENCOUNTER — Encounter: Payer: Self-pay | Admitting: Physician Assistant

## 2018-04-19 ENCOUNTER — Other Ambulatory Visit: Payer: Self-pay | Admitting: Physician Assistant

## 2018-04-19 DIAGNOSIS — L509 Urticaria, unspecified: Secondary | ICD-10-CM

## 2018-04-24 ENCOUNTER — Other Ambulatory Visit: Payer: Self-pay | Admitting: Physician Assistant

## 2018-04-24 DIAGNOSIS — J4531 Mild persistent asthma with (acute) exacerbation: Secondary | ICD-10-CM

## 2018-04-24 DIAGNOSIS — T364X5A Adverse effect of tetracyclines, initial encounter: Secondary | ICD-10-CM

## 2018-04-24 DIAGNOSIS — F331 Major depressive disorder, recurrent, moderate: Secondary | ICD-10-CM

## 2018-04-25 MED ORDER — DESONIDE 0.05 % EX CREA: TOPICAL_CREAM | Freq: Two times a day (BID) | CUTANEOUS | 0 refills | Status: DC

## 2018-04-25 MED ORDER — PROAIR HFA 108 (90 BASE) MCG/ACT IN AERS: INHALATION_SPRAY | RESPIRATORY_TRACT | 0 refills | Status: AC

## 2018-04-25 MED ORDER — BUPROPION HCL ER (XL) 150 MG PO TB24: 150.0000 mg | ORAL_TABLET | Freq: Every day | ORAL | 0 refills | Status: DC

## 2018-05-02 ENCOUNTER — Ambulatory Visit (INDEPENDENT_AMBULATORY_CARE_PROVIDER_SITE_OTHER): Payer: BLUE CROSS/BLUE SHIELD | Admitting: Physician Assistant

## 2018-05-02 VITALS — BP 148/73 | HR 75 | Temp 97.8°F | Wt 256.0 lb

## 2018-05-02 DIAGNOSIS — I10 Essential (primary) hypertension: Secondary | ICD-10-CM | POA: Diagnosis not present

## 2018-05-02 NOTE — Progress Notes (Signed)
Patient in today for BP check. BP at last visit was 143/109. Patient reports she is taking her BP meds as prescribed. BP in the office today is 148/73 pulse 75. Pt denies headaches, blurred vision, chest pain and shortness of breath.  Vitals:   05/02/18 1608  BP: (!) 148/73  Pulse: 75  Temp: 97.8 F (36.6 C)     Per provider Pt is to continue with current medications, work on low sodium diet, increase aerobic exercise and to follow up with her in 1 month.

## 2018-05-03 ENCOUNTER — Encounter: Payer: Self-pay | Admitting: Physician Assistant

## 2018-05-10 ENCOUNTER — Encounter: Payer: Self-pay | Admitting: Physician Assistant

## 2018-05-26 ENCOUNTER — Encounter: Payer: Self-pay | Admitting: Physician Assistant

## 2018-05-26 ENCOUNTER — Other Ambulatory Visit: Payer: Self-pay | Admitting: Physician Assistant

## 2018-05-26 DIAGNOSIS — F331 Major depressive disorder, recurrent, moderate: Secondary | ICD-10-CM

## 2018-05-26 DIAGNOSIS — Z1231 Encounter for screening mammogram for malignant neoplasm of breast: Secondary | ICD-10-CM

## 2018-06-02 ENCOUNTER — Other Ambulatory Visit: Payer: Self-pay

## 2018-06-02 ENCOUNTER — Telehealth (INDEPENDENT_AMBULATORY_CARE_PROVIDER_SITE_OTHER): Payer: BLUE CROSS/BLUE SHIELD | Admitting: Physician Assistant

## 2018-06-02 VITALS — BP 131/90 | Resp 15

## 2018-06-02 DIAGNOSIS — I10 Essential (primary) hypertension: Secondary | ICD-10-CM | POA: Diagnosis not present

## 2018-06-02 DIAGNOSIS — F331 Major depressive disorder, recurrent, moderate: Secondary | ICD-10-CM

## 2018-06-02 DIAGNOSIS — L509 Urticaria, unspecified: Secondary | ICD-10-CM

## 2018-06-02 DIAGNOSIS — F410 Panic disorder [episodic paroxysmal anxiety] without agoraphobia: Secondary | ICD-10-CM | POA: Diagnosis not present

## 2018-06-02 MED ORDER — BUSPIRONE HCL 7.5 MG PO TABS: ORAL_TABLET | ORAL | 0 refills | Status: DC

## 2018-06-02 MED ORDER — FLUOXETINE HCL 60 MG PO TABS: 60.0000 mg | ORAL_TABLET | ORAL | 0 refills | Status: DC

## 2018-06-02 MED ORDER — HYDROXYZINE HCL 25 MG PO TABS: ORAL_TABLET | ORAL | 1 refills | Status: DC

## 2018-06-02 NOTE — Progress Notes (Signed)
Virtual Visit via Video Note  I connected with [name] on 06/02/18 at  4:00 PM EDT by a video enabled telemedicine application and verified that I am speaking with the correct person using two identifiers.   I discussed the limitations of evaluation and management by telemedicine and the availability of in person appointments. The patient expressed understanding and agreed to proceed.  History of Present Illness: HPI:                                                                Cathy Oconnell is a 53 y.o. female   CC: medication management  HTN: taking Amlodipine 5 mg daily. 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 Compliant with medications. Denies vision change, headache, chest pain with exertion, orthopnea, lightheadedness, syncope and edema. Risk factors include: obesity, dyslipidemia   Depression/Anxiety: currently taking Fluoxetine and wellbutrin. Reports she has had increased panic attacks in the last 2 weeks and worsening depressive symptoms. Has trouble falling asleep - takes more than 1 hour to fall asleep nightly. Endorses passive SI, no plan, no self-harm. States she will talk to her husband when she has these thoughts and he calms her down. She also has support from her church and her pastor. Denies symptoms of mania/hypomania. Denies auditory/visual hallucinations.  Depression screen [facility] 2/9 06/21/2017 05/10/2017 07/09/2016 06/11/2016 05/14/2016  Decreased Interest 2 1 0 2 1  Down, Depressed, Hopeless PHQ - 2 Score Altered sleeping Tired, decreased energy Change in appetite Feeling bad or failure about yourself  Trouble concentrating 2 2 0 1 0  Moving slowly or fidgety/restless 3 3 0 1 1  Suicidal thoughts 3 3 0 1 1  PHQ-9 Score GAD 7 : Generalized Anxiety Score 06/21/2017 05/10/2017 07/09/2016  Nervous, Anxious, on Edge Control/stop worrying Worry too  much - different things Trouble relaxing Restless Easily annoyed or irritable Afraid - awful might happen Total GAD 7 Score Past Medical History:  Diagnosis Date  . Anxiety   . Asthma   . Chronic back pain   . Depression   . Epidermoid cyst of skin of chest 05/14/2016  . 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 Migraine headache   . Obesity   . Ovarian cyst, left 10/21/2016   2.3 cm left ovarian cystic lesion with indeterminate but probably benign characteristics. Recommend continued followup by transvaginal pelvic ultrasound in 6-12 weeks   Past Surgical History:  Procedure Laterality Date  . BACK SURGERY    . CESAREAN SECTION    . FOOT SURGERY Left    Social History   Tobacco Use  . Smoking status: Never Smoker  . Smokeless tobacco: Never Used  Substance Use Topics  . Alcohol use: Yes    Alcohol/week: 1.0 standard drinks    Types: 1  Glasses of wine per week   family history includes Breast cancer in her maternal aunt and mother.    ROS: negative except as noted in the HPI  Medications: Current Outpatient Medications  Medication Sig Dispense Refill  . amLODipine (NORVASC) 5 MG tablet Take 1 tablet (5 mg total) by mouth daily. 30 tablet 2  . atenolol (TENORMIN) 25 MG tablet Take 2 tablets (50 mg total) by mouth daily. 60 tablet 5  . baclofen (LIORESAL) 10 MG tablet Take one tablet (10 mg dose) by mouth 3 (three) times a day as needed.  2  . budesonide (PULMICORT) 180 MCG/ACT inhaler Inhale 1 puff into the lungs 2 (two) times daily. 1 each 3  . buPROPion (WELLBUTRIN XL) 150 MG 24 hr tablet Take 1 tablet (150 mg total) by mouth daily. 180 tablet 0  . desonide (DESOWEN) 0.05 % cream Apply topically 2 (two) times daily. 60 g 0  . EPINEPHrine 0.3 mg/0.3 mL IJ SOAJ injection INJECT AS DIRECTED ONCE AS NEEDED  1  . FLUoxetine (PROZAC) 20 MG tablet Take 2 tablets (40 mg total) by mouth at bedtime. 60  tablet 1  . Galcanezumab-gnlm 120 MG/ML SOAJ Inject into the skin.    Marland Kitchen HYDROmorphone (DILAUDID) 4 MG tablet     . hydrOXYzine (ATARAX/VISTARIL) 25 MG tablet TAKE 1 TO 2 TABLETS(25 TO 50 MG) BY MOUTH AT BEDTIME AS NEEDED FOR ITCHING 90 tablet 1  . levocetirizine (XYZAL) 5 MG tablet Take 1 tablet (5 mg total) by mouth daily. 30 tablet 5  . montelukast (SINGULAIR) 10 MG tablet Take 1 tablet (10 mg total) by mouth daily. 30 tablet 5  . PROAIR HFA 108 (90 Base) MCG/ACT inhaler INHALE 1 TO 2 PUFFS INTO THE LUNGS EVERY 4 HOURS AS NEEDED FOR WHEEZING OR SHORTNESS OF BREATH 8.5 g 0  . rizatriptan (MAXALT-MLT) 10 MG disintegrating tablet Take 1 tablet (10 mg total) by mouth as needed for migraine. May repeat in 2 hours if needed 18 tablet 1  . traZODone (DESYREL) 100 MG tablet Take 1 tablet (100 mg total) by mouth at bedtime. 90 tablet 1   No current facility-administered medications for this visit.    Allergies  Allergen Reactions  . Aspirin Anaphylaxis       Objective:  There were no vitals taken for this visit.  Vitals:   06/02/18 1604  BP: 131/90  Resp: 15  Gen:  alert, not ill-appearing, no distress, appropriate for age HEENT: head normocephalic without obvious abnormality, conjunctiva and cornea clear, trachea midline Pulm: Normal work of breathing, normal phonation Neuro: alert and oriented x 3 Psych: cooperative, depressed mood, affect mood-congruent, speech is articulate, normal rate and volume; thought processes clear and goal-directed, normal judgment, good insight, passive SI    Lab Results  Component Value Date   CREATININE 0.78 04/18/2018   BUN 7 04/18/2018   NA 140 04/18/2018   K 3.8 04/18/2018   CL 105 04/18/2018   CO2 24 04/18/2018     Assessment and Plan: 53 y.o. female with   .Cordia was seen today for follow-up.  Diagnoses and all orders for this visit:  Moderate episode of recurrent major depressive disorder (HCC) -     Discontinue: FLUoxetine HCl 60  MG TABS; Take 60 mg by mouth every morning. -     Discontinue: busPIRone (BUSPAR) 7.5 MG tablet; 1 tab PO QAM and up to three times daily prn anxiety/panic  Panic disorder -     Discontinue: FLUoxetine HCl  60 MG TABS; Take 60 mg by mouth every morning. -     Discontinue: busPIRone (BUSPAR) 7.5 MG tablet; 1 tab PO QAM and up to three times daily prn anxiety/panic  Urticaria -     hydrOXYzine (ATARAX/VISTARIL) 25 MG tablet; TAKE 1 TO 2 TABLETS(25 TO 50 MG) BY MOUTH AT BEDTIME AS NEEDED FOR ITCHING/ANXIETY  GAD7=21 PHQ9=25, no acute safety issues Endorses passive SI, verbally contracted for safety today Increasing Fluoxetine to 60 mg QD Adding Buspar 7.5 mg daily and up to tid prn Will avoid benzodiazepine due to chronic opioid therapy  HTN Still diastolic elevation, but significantly improved Cont Amlodipine 5 mg QD and Atenolol 50 mg QD  Follow-up in 2-4 weeks  Follow Up Instructions:    I discussed the assessment and treatment plan with the patient. The patient was provided an opportunity to ask questions and all were answered. The patient agreed with the plan and demonstrated an understanding of the instructions.   The patient was advised to call back or seek an in-person evaluation if the symptoms worsen or if the condition fails to improve as anticipated.  I provided approx 15 minutes of non-face-to-face time during this encounter.   Carlis Stableharley Elizabeth Izzy Doubek, [place] JerseyPA-C

## 2018-06-03 ENCOUNTER — Telehealth: Payer: BLUE CROSS/BLUE SHIELD | Admitting: Nurse Practitioner

## 2018-06-03 ENCOUNTER — Encounter: Payer: Self-pay | Admitting: Physician Assistant

## 2018-06-03 ENCOUNTER — Other Ambulatory Visit: Payer: Self-pay | Admitting: Physician Assistant

## 2018-06-03 DIAGNOSIS — L239 Allergic contact dermatitis, unspecified cause: Secondary | ICD-10-CM

## 2018-06-03 DIAGNOSIS — T364X5A Adverse effect of tetracyclines, initial encounter: Secondary | ICD-10-CM

## 2018-06-03 MED ORDER — PREDNISONE 10 MG (21) PO TBPK: ORAL_TABLET | ORAL | 0 refills | Status: DC

## 2018-06-03 NOTE — Progress Notes (Signed)
E Visit for Rash  We are sorry that you are not feeling well. Here is how we plan to help!   After speaking with you on the phone, it sounds like an allergic reaction to something. I would suggest:  benadryl or zyrtec OTC for itching Avoid hot water to areas Cool compresses Try not to scratch Let us know or contact your PCP if not getting better.   Prednisone 10 mg daily for 6 days (see taper instructions below)  Directions for 6 day taper: Day 1: 2 tablets before breakfast, 1 after both lunch & dinner and 2 at bedtime Day 2: 1 tab before breakfast, 1 after both lunch & dinner and 2 at bedtime Day 3: 1 tab at each meal & 1 at bedtime Day 4: 1 tab at breakfast, 1 at lunch, 1 at bedtime Day 5: 1 tab at breakfast & 1 tab at bedtime Day 6: 1 tab at breakfast      HOME CARE:   Take cool showers and avoid direct sunlight.  Apply cool compress or wet dressings.  Take a bath in an oatmeal bath.  Sprinkle content of one Aveeno packet under running faucet with comfortably warm water.  Bathe for 15-20 minutes, 1-2 times daily.  Pat dry with a towel. Do not rub the rash.  Use hydrocortisone cream.  Take an antihistamine like Benadryl for widespread rashes that itch.  The adult dose of Benadryl is 25-50 mg by mouth 4 times daily.  Caution:  This type of medication may cause sleepiness.  Do not drink alcohol, drive, or operate dangerous machinery while taking antihistamines.  Do not take these medications if you have prostate enlargement.  Read package instructions thoroughly on all medications that you take.  GET HELP RIGHT AWAY IF:   Symptoms don't go away after treatment.  Severe itching that persists.  If you rash spreads or swells.  If you rash begins to smell.  If it blisters and opens or develops a yellow-brown crust.  You develop a fever.  You have a sore throat.  You become short of breath.  MAKE SURE YOU:  Understand these instructions. Will watch your  condition. Will get help right away if you are not doing well or get worse.  Thank you for choosing an e-visit. Your e-visit answers were reviewed by a board certified advanced clinical practitioner to complete your personal care plan. Depending upon the condition, your plan could have included both over the counter or prescription medications. Please review your pharmacy choice. Be sure that the pharmacy you have chosen is open so that you can pick up your prescription now.  If there is a problem you may message your provider in MyChart to have the prescription routed to another pharmacy. Your safety is important to [place]. If you have drug allergies check your prescription carefully.  For the next 24 hours, you can use MyChart to ask questions about today's visit, request a non-urgent call back, or ask for a work or school excuse from your e-visit provider. You will get an email in the next two days asking about your experience. I hope that your e-visit has been valuable and will speed your recovery.   6 minutes spent reviewing and documenting in chart.

## 2018-06-06 ENCOUNTER — Telehealth (INDEPENDENT_AMBULATORY_CARE_PROVIDER_SITE_OTHER): Payer: BLUE CROSS/BLUE SHIELD | Admitting: Physician Assistant

## 2018-06-06 ENCOUNTER — Encounter: Payer: Self-pay | Admitting: Physician Assistant

## 2018-06-06 DIAGNOSIS — R21 Rash and other nonspecific skin eruption: Secondary | ICD-10-CM

## 2018-06-06 DIAGNOSIS — M4716 Other spondylosis with myelopathy, lumbar region: Secondary | ICD-10-CM | POA: Insufficient documentation

## 2018-06-06 DIAGNOSIS — G894 Chronic pain syndrome: Secondary | ICD-10-CM | POA: Insufficient documentation

## 2018-06-06 DIAGNOSIS — M461 Sacroiliitis, not elsewhere classified: Secondary | ICD-10-CM | POA: Insufficient documentation

## 2018-06-06 DIAGNOSIS — M5136 Other intervertebral disc degeneration, lumbar region: Secondary | ICD-10-CM | POA: Insufficient documentation

## 2018-06-06 DIAGNOSIS — G5710 Meralgia paresthetica, unspecified lower limb: Secondary | ICD-10-CM

## 2018-06-06 DIAGNOSIS — M51369 Other intervertebral disc degeneration, lumbar region without mention of lumbar back pain or lower extremity pain: Secondary | ICD-10-CM | POA: Insufficient documentation

## 2018-06-06 MED ORDER — TRIAMCINOLONE ACETONIDE 0.5 % EX OINT: 1.0000 "application " | TOPICAL_OINTMENT | Freq: Two times a day (BID) | CUTANEOUS | 0 refills | Status: AC

## 2018-06-06 NOTE — Telephone Encounter (Signed)
Approved today (Fluoxetine) CaseId:[id number];Status:Approved;Review Type:Prior Auth;Coverage Start Date:05/07/2018;Coverage End Date:06/06/2019; Pharmacy aware.

## 2018-06-06 NOTE — Progress Notes (Signed)
Virtual Visit via Video Note  I connected with [name] on 06/06/18 at  8:30 AM EDT by a video enabled telemedicine application and verified that I am speaking with the correct person using two identifiers.   I discussed the limitations of evaluation and management by telemedicine and the availability of in person appointments. The patient expressed understanding and agreed to proceed.  History of Present Illness: Cathy Oconnell has had a recurrent right-sided neck rash for approx 1 week that has been gradually worsening and spreading. 4 days ago rash spread to her face and the other side of her neck. She did an e-visit that day and was started on Prednisone. She then noted right eyelid at the medial canthus also began itching and burning. She states rash is red, extremely itchy and is scaly. Skin has begun peeling off. Denies any scleral injection, eye pain/drainage or vision change. Denies fever, dysuria, throat pain, or oral lesions. Denies any abnormal bruising or purplish skin lesions. Denies facial/lip/tongue swelling or dyspnea. In addition to Prednisone she is using Desowen cream and Hydroxyzine.  Past Medical History:  Diagnosis Date  . Anxiety   . Asthma   . Chronic back pain   . Depression   . Epidermoid cyst of skin of chest 05/14/2016  . 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 Migraine headache   . Obesity   . Ovarian cyst, left 10/21/2016   2.3 cm left ovarian cystic lesion with indeterminate but probably benign characteristics. Recommend continued followup by transvaginal pelvic ultrasound in 6-12 weeks   Past Surgical History:  Procedure Laterality Date  . BACK SURGERY    . CESAREAN SECTION    . FOOT SURGERY Left    Social History   Tobacco Use  . Smoking status: Never Smoker  . Smokeless tobacco: Never Used  Substance Use Topics  . Alcohol use: Yes    Alcohol/week: 1.0 standard drinks    Types: 1 Glasses of wine per week   family  history includes Breast cancer in her maternal aunt and mother.    ROS: negative except as noted in the HPI  Medications: Current Outpatient Medications  Medication Sig Dispense Refill  . acetaminophen (TYLENOL) 325 MG tablet 1 tablet    . atenolol (TENORMIN) 25 MG tablet Take 2 tablets (50 mg total) by mouth daily. 60 tablet 5  . baclofen (LIORESAL) 10 MG tablet Take one tablet (10 mg dose) by mouth 3 (three) times a day as needed.  2  . budesonide (PULMICORT) 180 MCG/ACT inhaler Inhale 1 puff into the lungs 2 (two) times daily. 1 each 3  . buPROPion (WELLBUTRIN XL) 150 MG 24 hr tablet Take 1 tablet (150 mg total) by mouth daily. 180 tablet 0  . busPIRone (BUSPAR) 7.5 MG tablet 1 tab PO QAM and up to three times daily prn anxiety/panic 90 tablet 0  . EPINEPHrine 0.3 mg/0.3 mL IJ SOAJ injection INJECT AS DIRECTED ONCE AS NEEDED  1  . FLUoxetine HCl 60 MG TABS Take 60 mg by mouth every morning. 90 tablet 0  . gabapentin (NEURONTIN) 300 MG capsule TK 1 C PO QD    . Galcanezumab-gnlm 120 MG/ML SOAJ Inject into the skin.    Marland Kitchen HYDROmorphone (DILAUDID) 4 MG tablet     . hydrOXYzine (ATARAX/VISTARIL) 25 MG tablet TAKE 1 TO 2 TABLETS(25 TO 50 MG) BY MOUTH AT BEDTIME AS NEEDED FOR ITCHING/ANXIETY 90 tablet 1  . levocetirizine (XYZAL) 5 MG tablet Take 1 tablet (  5 mg total) by mouth daily. 30 tablet 5  . montelukast (SINGULAIR) 10 MG tablet Take 1 tablet (10 mg total) by mouth daily. 30 tablet 5  . predniSONE (STERAPRED UNI-PAK 21 TAB) 10 MG (21) TBPK tablet As directed x 6 days 21 tablet 0  . PROAIR HFA 108 (90 Base) MCG/ACT inhaler INHALE 1 TO 2 PUFFS INTO THE LUNGS EVERY 4 HOURS AS NEEDED FOR WHEEZING OR SHORTNESS OF BREATH 8.5 g 0  . rizatriptan (MAXALT-MLT) 10 MG disintegrating tablet Take 1 tablet (10 mg total) by mouth as needed for migraine. May repeat in 2 hours if needed 18 tablet 1  . triamcinolone ointment (KENALOG) 0.5 % Apply 1 application topically 2 (two) times daily for 7 days. To  affected area, avoid eyes 30 g 0   No current facility-administered medications for this visit.    Allergies  Allergen Reactions  . Aspirin Anaphylaxis       Objective:  There were no vitals taken for this visit. Gen:  alert, not ill-appearing, no distress, appropriate for age HEENT: head normocephalic without obvious abnormality, conjunctiva and cornea clear, no visible eyelid swelling, trachea midline Pulm: Normal work of breathing, normal phonation, speaking in full sentences Neuro: alert and oriented x 3 Skin: anterior neck there is a large area encompassing the entire inferior portion of the neck that is diffusely red, scaly plaque; skin appears almost lichenified, physical exam limited due to video quality    No results found for this or any previous visit (from the past 72 hour(s)). No results found.     Assessment and Plan: 53 y.o. female with   .Diagnoses and all orders for this visit:  Rash and nonspecific skin eruption -     triamcinolone ointment (KENALOG) 0.5 %; Apply 1 application topically 2 (two) times daily for 7 days. To affected area, avoid eyes   Recurrent scaly, pruritic, red rash of neck and face including right medial canthus present for 1 week that has not improved with systemic steroids She does not have mucous membrane involvement to suggest TEN or SJS, but this is still on the differential Rash was present before starting Buspirone, but after starting Amlodipine Recommend holding Amlodipine in case this is a drug eruption Cont steroid taper Switching from Desowen to Triamcinolone ointment bid (avoid use around the eye/eyelid) Recommended using Aquafor in between topical steroid to moisturize and prevent further scaling/peeling Counseled on general measures for rash including removal of make-up and avoiding soaps/perfumes. Use non-soap cleanser as needed Follow-up in office in 3 days or sooner as need. Will consider biopsy since rash is recurrent  and involves face/eyelid. Dermatomyositis is also in the differential  Follow Up Instructions:    I discussed the assessment and treatment plan with the patient. The patient was provided an opportunity to ask questions and all were answered. The patient agreed with the plan and demonstrated an understanding of the instructions.   The patient was advised to call back or seek an in-person evaluation if the symptoms worsen or if the condition fails to improve as anticipated.  I provided 10 minutes of non-face-to-face time during this encounter.   Carlis Stableharley Elizabeth Anisa Leanos, [place]-C

## 2018-06-08 ENCOUNTER — Encounter: Payer: Self-pay | Admitting: Physician Assistant

## 2018-06-09 ENCOUNTER — Ambulatory Visit: Payer: BLUE CROSS/BLUE SHIELD | Admitting: Physician Assistant

## 2018-06-23 DIAGNOSIS — M5416 Radiculopathy, lumbar region: Secondary | ICD-10-CM | POA: Diagnosis not present

## 2018-06-23 DIAGNOSIS — M4716 Other spondylosis with myelopathy, lumbar region: Secondary | ICD-10-CM | POA: Diagnosis not present

## 2018-06-23 DIAGNOSIS — M5136 Other intervertebral disc degeneration, lumbar region: Secondary | ICD-10-CM | POA: Diagnosis not present

## 2018-06-23 DIAGNOSIS — G894 Chronic pain syndrome: Secondary | ICD-10-CM | POA: Diagnosis not present

## 2018-06-24 ENCOUNTER — Other Ambulatory Visit: Payer: Self-pay | Admitting: Physician Assistant

## 2018-06-24 DIAGNOSIS — F331 Major depressive disorder, recurrent, moderate: Secondary | ICD-10-CM

## 2018-06-24 DIAGNOSIS — F410 Panic disorder [episodic paroxysmal anxiety] without agoraphobia: Secondary | ICD-10-CM

## 2018-06-27 MED ORDER — BUPROPION HCL ER (XL) 150 MG PO TB24: 150.0000 mg | ORAL_TABLET | Freq: Every day | ORAL | 0 refills | Status: AC

## 2018-06-27 MED ORDER — FLUOXETINE HCL 60 MG PO TABS: 60.0000 mg | ORAL_TABLET | ORAL | 0 refills | Status: DC

## 2018-06-27 MED ORDER — BUSPIRONE HCL 7.5 MG PO TABS: ORAL_TABLET | ORAL | 0 refills | Status: DC

## 2018-07-07 ENCOUNTER — Encounter: Payer: Self-pay | Admitting: Physician Assistant

## 2018-07-07 ENCOUNTER — Ambulatory Visit (INDEPENDENT_AMBULATORY_CARE_PROVIDER_SITE_OTHER): Payer: BLUE CROSS/BLUE SHIELD | Admitting: Physician Assistant

## 2018-07-07 VITALS — BP 161/90 | HR 77 | Temp 97.7°F | Wt 258.0 lb

## 2018-07-07 DIAGNOSIS — F410 Panic disorder [episodic paroxysmal anxiety] without agoraphobia: Secondary | ICD-10-CM

## 2018-07-07 DIAGNOSIS — G43109 Migraine with aura, not intractable, without status migrainosus: Secondary | ICD-10-CM | POA: Diagnosis not present

## 2018-07-07 DIAGNOSIS — I10 Essential (primary) hypertension: Secondary | ICD-10-CM

## 2018-07-07 DIAGNOSIS — F5105 Insomnia due to other mental disorder: Secondary | ICD-10-CM

## 2018-07-07 DIAGNOSIS — F418 Other specified anxiety disorders: Secondary | ICD-10-CM

## 2018-07-07 DIAGNOSIS — F331 Major depressive disorder, recurrent, moderate: Secondary | ICD-10-CM | POA: Diagnosis not present

## 2018-07-07 MED ORDER — ATENOLOL 50 MG PO TABS: 50.0000 mg | ORAL_TABLET | Freq: Every day | ORAL | 1 refills | Status: AC

## 2018-07-07 MED ORDER — CHLORTHALIDONE 25 MG PO TABS: 25.0000 mg | ORAL_TABLET | Freq: Every day | ORAL | 0 refills | Status: DC

## 2018-07-07 MED ORDER — BUSPIRONE HCL 7.5 MG PO TABS: 15.0000 mg | ORAL_TABLET | Freq: Two times a day (BID) | ORAL | 0 refills | Status: DC

## 2018-07-07 MED ORDER — GABAPENTIN 300 MG PO CAPS: 300.0000 mg | ORAL_CAPSULE | Freq: Every day | ORAL | 0 refills | Status: DC

## 2018-07-07 MED ORDER — FLUOXETINE HCL 60 MG PO TABS: 60.0000 mg | ORAL_TABLET | ORAL | 0 refills | Status: AC

## 2018-07-07 NOTE — Patient Instructions (Signed)
For sleep/anxiety: Start taking 1-2 Gabapentin at bedtime every night Do not take any more Tylenol PM Increase your Buspirone to 2 tablets (15 mg) twice a day. You can take an additional dose in the evening if needed (max dose 60 mg/day) Continue your Fluoxetine 60 mg daily Schedule a meeting with your pastor Reach out to me over MyChart if you are having any issues with this plan  Other things that may help with sleep: Mindfulness/meditation apps such as Headspace Changing your bed linens OTC Magneisum Bisglycinate supplement Sleepy time tea Soaking in a warm bath Rubbing lavendar oil on wrists/neck or using aromatherapy diffuser   Insomnia Insomnia is a sleep disorder that makes it difficult to fall asleep or stay asleep. Insomnia can cause fatigue, low energy, difficulty concentrating, mood swings, and poor performance at work or school. There are three different ways to classify insomnia:  Difficulty falling asleep.  Difficulty staying asleep.  Waking up too early in the morning. Any type of insomnia can be long-term (chronic) or short-term (acute). Both are common. Short-term insomnia usually lasts for three months or less. Chronic insomnia occurs at least three times a week for longer than three months. What are the causes? Insomnia may be caused by another condition, situation, or substance, such as:  Anxiety.  Certain medicines.  Gastroesophageal reflux disease (GERD) or other gastrointestinal conditions.  Asthma or other breathing conditions.  Restless legs syndrome, sleep apnea, or other sleep disorders.  Chronic pain.  Menopause.  Stroke.  Abuse of alcohol, tobacco, or illegal drugs.  Mental health conditions, such as depression.  Caffeine.  Neurological disorders, such as Alzheimer's disease.  An overactive thyroid (hyperthyroidism). Sometimes, the cause of insomnia may not be known. What increases the risk? Risk factors for insomnia include:   Gender. Women are affected more often than men.  Age. Insomnia is more common as you get older.  Stress.  Lack of exercise.  Irregular work schedule or working night shifts.  Traveling between different time zones.  Certain medical and mental health conditions. What are the signs or symptoms? If you have insomnia, the main symptom is having trouble falling asleep or having trouble staying asleep. This may lead to other symptoms, such as:  Feeling fatigued or having low energy.  Feeling nervous about going to sleep.  Not feeling rested in the morning.  Having trouble concentrating.  Feeling irritable, anxious, or depressed. How is this diagnosed? This condition may be diagnosed based on:  Your symptoms and medical history. Your health care provider may ask about: ? Your sleep habits. ? Any medical conditions you have. ? Your mental health.  A physical exam. How is this treated? Treatment for insomnia depends on the cause. Treatment may focus on treating an underlying condition that is causing insomnia. Treatment may also include:  Medicines to help you sleep.  Counseling or therapy.  Lifestyle adjustments to help you sleep better. Follow these instructions at home: Eating and drinking   Limit or avoid alcohol, caffeinated beverages, and cigarettes, especially close to bedtime. These can disrupt your sleep.  Do not eat a large meal or eat spicy foods right before bedtime. This can lead to digestive discomfort that can make it hard for you to sleep. Sleep habits   Keep a sleep diary to help you and your health care provider figure out what could be causing your insomnia. Write down: ? When you sleep. ? When you wake up during the night. ? How well you sleep. ?  How rested you feel the next day. ? Any side effects of medicines you are taking. ? What you eat and drink.  Make your bedroom a dark, comfortable place where it is easy to fall asleep. ? Put up shades  or blackout curtains to block light from outside. ? Use a white noise machine to block noise. ? Keep the temperature cool.  Limit screen use before bedtime. This includes: ? Watching TV. ? Using your smartphone, tablet, or computer.  Stick to a routine that includes going to bed and waking up at the same times every day and night. This can help you fall asleep faster. Consider making a quiet activity, such as reading, part of your nighttime routine.  Try to avoid taking naps during the day so that you sleep better at night.  Get out of bed if you are still awake after 15 minutes of trying to sleep. Keep the lights down, but try reading or doing a quiet activity. When you feel sleepy, go back to bed. General instructions  Take over-the-counter and prescription medicines only as told by your health care provider.  Exercise regularly, as told by your health care provider. Avoid exercise starting several hours before bedtime.  Use relaxation techniques to manage stress. Ask your health care provider to suggest some techniques that may work well for you. These may include: ? Breathing exercises. ? Routines to release muscle tension. ? Visualizing peaceful scenes.  Make sure that you drive carefully. Avoid driving if you feel very sleepy.  Keep all follow-up visits as told by your health care provider. This is important. Contact a health care provider if:  You are tired throughout the day.  You have trouble in your daily routine due to sleepiness.  You continue to have sleep problems, or your sleep problems get worse. Get help right away if:  You have serious thoughts about hurting yourself or someone else. If you ever feel like you may hurt yourself or others, or have thoughts about taking your own life, get help right away. You can go to your nearest emergency department or call:  Your local emergency services (911 in the [place]  A suicide crisis helpline, such as the National  Suicide Prevention Lifeline at 551 422 6486. This is open 24 hours a day. Summary  Insomnia is a sleep disorder that makes it difficult to fall asleep or stay asleep.  Insomnia can be long-term (chronic) or short-term (acute).  Treatment for insomnia depends on the cause. Treatment may focus on treating an underlying condition that is causing insomnia.  Keep a sleep diary to help you and your health care provider figure out what could be causing your insomnia. This information is not intended to replace advice given to you by your health care provider. Make sure you discuss any questions you have with your health care provider. Document Released: 02/07/2000 Document Revised: 11/19/2016 Document Reviewed: 11/19/2016 Elsevier Interactive Patient Education  2019 [employer].

## 2018-07-07 NOTE — Progress Notes (Signed)
HPI:                                                                Cathy Oconnell is a 53 y.o. female who presents to [facility] Cathy Oconnell: Primary Care Sports Medicine today for anxiety/depression follow-up  For approximately the last 2 months since the start of the COVID-19 pandemic patient has reported increased depressive and anxiety symptoms Mood - " I feel down" reports increased tearfulness for no apparent reason Anxiety -increased, endorses excessive worry and restlessness Sleep - " horrible," reports she is getting about 2-1/2 to 3-1/2 hours nightly for the last 4 to 5 weeks.  She has been taking Tylenol PM occasionally  At last office visit fluoxetine was increased from 40 mg to 60 mg.  Added buspirone 7 and half milligrams 3 times daily as needed.  She reports she is currently only taking 7-1/2 mg at bedtime.   She is currently furloughed and worried about employment.  She does report that husband is employed and they are not in any sort of financial danger. She lives at home with her husband who is supportive.  She states that she talks to him whenever she is feeling sad or having suicidal thoughts and he helps her through this.  She also states that she has a strong church community and a good relationship with her pastor.    Depression screen [facility] 2/9 07/07/2018 06/02/2018 06/21/2017 05/10/2017 07/09/2016  Decreased Interest 0  Down, Depressed, Hopeless PHQ - 2 Score Altered sleeping Tired, decreased energy Change in appetite Feeling bad or failure about yourself  Trouble concentrating 0  Moving slowly or fidgety/restless 1 0 3 3 0  Suicidal thoughts 1 0 3 3 0  PHQ-9 Score Difficult doing work/chores Extremely dIfficult Very difficult - - -    GAD 7 : Generalized Anxiety Score 07/07/2018 06/21/2017 05/10/2017 07/09/2016  Nervous, Anxious, on Edge Control/stop  worrying Worry too much - different things Trouble relaxing Restless Easily annoyed or irritable Afraid - awful might happen Total GAD 7 Score Anxiety Difficulty Extremely difficult - - -      Past Medical History:  Diagnosis Date  . Anxiety   . Asthma   . Chronic back pain   . Depression   . Epidermoid cyst of skin of chest 05/14/2016  . 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 Migraine headache   . Obesity   . Ovarian cyst, left 10/21/2016   2.3 cm left ovarian cystic lesion with indeterminate but probably benign characteristics. Recommend continued followup by transvaginal pelvic ultrasound in 6-12 weeks   Past Surgical History:  Procedure Laterality Date  . BACK SURGERY    . CESAREAN SECTION    . FOOT SURGERY  Left    Social History   Tobacco Use  . Smoking status: Never Smoker  . Smokeless tobacco: Never Used  Substance Use Topics  . Alcohol use: Yes    Alcohol/week: 1.0 standard drinks    Types: 1 Glasses of wine per week   family history includes Breast cancer in her maternal aunt and mother.    ROS: negative except as noted in the HPI  Medications: Current Outpatient Medications  Medication Sig Dispense Refill  . acetaminophen (TYLENOL) 325 MG tablet 1 tablet    . atenolol (TENORMIN) 50 MG tablet Take 1 tablet (50 mg total) by mouth at bedtime. 90 tablet 1  . baclofen (LIORESAL) 10 MG tablet Take one tablet (10 mg dose) by mouth 3 (three) times a day as needed.  2  . budesonide (PULMICORT) 180 MCG/ACT inhaler Inhale 1 puff into the lungs 2 (two) times daily. 1 each 3  . buPROPion (WELLBUTRIN XL) 150 MG 24 hr tablet Take 1 tablet (150 mg total) by mouth daily. 180 tablet 0  . busPIRone (BUSPAR) 7.5 MG tablet Take 2 tablets (15 mg total) by mouth 2 (two) times daily. May take 1 additional dose as needed for panic/anxiety 90 tablet 0  . EPINEPHrine 0.3 mg/0.3 mL IJ SOAJ  injection INJECT AS DIRECTED ONCE AS NEEDED  1  . FLUoxetine HCl 60 MG TABS Take 60 mg by mouth every morning. 90 tablet 0  . gabapentin (NEURONTIN) 300 MG capsule Take 1-2 capsules (300-600 mg total) by mouth at bedtime. 60 capsule 0  . Galcanezumab-gnlm 120 MG/ML SOAJ Inject into the skin.    Marland Kitchen. HYDROmorphone (DILAUDID) 4 MG tablet     . hydrOXYzine (ATARAX/VISTARIL) 25 MG tablet TAKE 1 TO 2 TABLETS(25 TO 50 MG) BY MOUTH AT BEDTIME AS NEEDED FOR ITCHING/ANXIETY 90 tablet 1  . ketorolac (TORADOL) 10 MG tablet 1 tablet with food or milk as needed    . levocetirizine (XYZAL) 5 MG tablet Take 1 tablet (5 mg total) by mouth daily. 30 tablet 5  . PROAIR HFA 108 (90 Base) MCG/ACT inhaler INHALE 1 TO 2 PUFFS INTO THE LUNGS EVERY 4 HOURS AS NEEDED FOR WHEEZING OR SHORTNESS OF BREATH 8.5 g 0  . rizatriptan (MAXALT-MLT) 10 MG disintegrating tablet Take 1 tablet (10 mg total) by mouth as needed for migraine. May repeat in 2 hours if needed 18 tablet 1  . chlorthalidone (HYGROTON) 25 MG tablet Take 1 tablet (25 mg total) by mouth daily. 90 tablet 0   No current facility-administered medications for this visit.    Allergies  Allergen Reactions  . Aspirin Anaphylaxis  . Amlodipine Rash       Objective:  BP (!) 161/90   Pulse 77   Temp 97.7 F (36.5 C) (Oral)   Wt 258 lb (117 kg)   BMI 45.70 kg/m  Vitals:   07/07/18 1608 07/07/18 1703  BP: (!) 152/97 (!) 161/90  Pulse: 77   Temp: 97.7 F (36.5 C)   Gen:  alert, not ill-appearing, no distress, appropriate for age HEENT: head normocephalic without obvious abnormality, conjunctiva and cornea clear, trachea midline Pulm: Normal work of breathing, normal phonation Neuro: alert and oriented x 3, no tremor MSK: extremities atraumatic, normal gait and station Skin: intact, no rashes on exposed skin, no jaundice, no cyanosis Psych: cooperative, depressed mood, affect mood-congruent, tearful, speech is articulate, normal rate and volume; thought  processes clear and goal-directed, normal judgment, good insight, passive SI  BP Readings from Last  3 Encounters:  07/07/18 (!) 161/90  06/02/18 131/90  05/02/18 (!) 148/73     Assessment and Plan: 53 y.o. female with   .Amyri was seen today for medication management.  Diagnoses and all orders for this visit:  Moderate episode of recurrent major depressive disorder (HCC) -     busPIRone (BUSPAR) 7.5 MG tablet; Take 2 tablets (15 mg total) by mouth 2 (two) times daily. May take 1 additional dose as needed for panic/anxiety -     FLUoxetine HCl 60 MG TABS; Take 60 mg by mouth every morning.  Panic disorder -     gabapentin (NEURONTIN) 300 MG capsule; Take 1-2 capsules (300-600 mg total) by mouth at bedtime. -     busPIRone (BUSPAR) 7.5 MG tablet; Take 2 tablets (15 mg total) by mouth 2 (two) times daily. May take 1 additional dose as needed for panic/anxiety -     FLUoxetine HCl 60 MG TABS; Take 60 mg by mouth every morning.  Migraine with aura and without status migrainosus, not intractable Comments: Failed Topamax and Trokendi. Atenolol started 11/03/16 Orders: -     atenolol (TENORMIN) 50 MG tablet; Take 1 tablet (50 mg total) by mouth at bedtime.  Insomnia secondary to depression with anxiety -     gabapentin (NEURONTIN) 300 MG capsule; Take 1-2 capsules (300-600 mg total) by mouth at bedtime.  Hypertension goal BP (blood pressure) < 130/80 -     chlorthalidone (HYGROTON) 25 MG tablet; Take 1 tablet (25 mg total) by mouth daily.  Elevated blood pressure reading in office with diagnosis of hypertension    MDD, Panic Disorder, Insomnia PHQ9=17, largely unchanged GAD7=13, improved from 21 with increased dose of Fluoxetine Still endorsing passive SI, but decreased frequency and no acute safety issues. Strong social supports and coping skills. Safety plan reviewed We discussed referral to counseling. She will schedule a meeting with her pastor She currently takes  Gabapentin prn for nighttime leg pain, but has not been taking this medication recently. Recommend scheduling Gabapentin 300-600 mg QHS for insomnia Increase Buspar to 15 mg bid with optional third dose prn (max 60 mg/day) D/c Singulair in case this is contributing to depression/SI Cont Fluoxetine 60 mg and Wellbutrin XL 150 mg Encouraged sleep hygiene  HTN BP out of range today, asymptomatic Amlodipine was held approx 1 month ago for a rash and she never resumed taking it Unclear if CCB was the cause of this rash Switching to Chlorthalidone 25 mg QD Cont Atenolol 50 mg, she does not want to adjust beta blocker because migraines are well controlled Check electrolytes in 1 month  Patient education and anticipatory guidance given Patient agrees with treatment plan Follow-up e-visit in 2 weeks or sooner as needed if symptoms worsen or fail to improve  Levonne Hubert PA-C

## 2018-07-14 ENCOUNTER — Encounter: Payer: Self-pay | Admitting: Physician Assistant

## 2018-07-15 ENCOUNTER — Encounter: Payer: Self-pay | Admitting: Physician Assistant

## 2018-07-15 ENCOUNTER — Ambulatory Visit (INDEPENDENT_AMBULATORY_CARE_PROVIDER_SITE_OTHER): Payer: BLUE CROSS/BLUE SHIELD | Admitting: Physician Assistant

## 2018-07-15 ENCOUNTER — Ambulatory Visit (INDEPENDENT_AMBULATORY_CARE_PROVIDER_SITE_OTHER): Payer: BLUE CROSS/BLUE SHIELD | Admitting: Sports Medicine

## 2018-07-15 ENCOUNTER — Encounter: Payer: Self-pay | Admitting: Sports Medicine

## 2018-07-15 VITALS — Ht 63.0 in | Wt 230.0 lb

## 2018-07-15 DIAGNOSIS — M1712 Unilateral primary osteoarthritis, left knee: Secondary | ICD-10-CM | POA: Diagnosis not present

## 2018-07-15 DIAGNOSIS — M25562 Pain in left knee: Secondary | ICD-10-CM

## 2018-07-15 MED ORDER — MELOXICAM 15 MG PO TABS: ORAL_TABLET | ORAL | 3 refills | Status: DC

## 2018-07-15 NOTE — Assessment & Plan Note (Signed)
Aspiration, injection, meloxicam. Return to see me in a month. Rehab exercises given.

## 2018-07-15 NOTE — Progress Notes (Signed)
Virtual Visit via Telephone Note  I connected with Cathy Oconnell on 07/15/18 at 10:50 AM EDT by telephone and verified that I am speaking with the correct person using two identifiers.   I discussed the limitations, risks, security and privacy concerns of performing an evaluation and management service by telephone and the availability of in person appointments. I also discussed with the patient that there may be a patient responsible charge related to this service. The patient expressed understanding and agreed to proceed.   History of Present Illness: HPI:                                                                Cathy Oconnell is a 53 y.o. female   CC: L knee pain  Left knee pain - patient with known knee OA reports gradually worsening pain for several weeks. States pain became severe, intolerable 5 days ago on Monday. She admits she has been doing more activity and is in the process of moving. Her left knee appears swollen. She is a chronic pain patient on dilaudid, gabapentin and toradol and this is not helping with her knee pain. In the past she had relief with injection.  As far as her mood and anxiety, she was supposed to follow-up next week but there was some confusion with the schedulers and today's visit is really for the knee pain. Overall she reports no change in mood. No worsening symptoms. She is taking Gabapentin 600 mg at bedtime. She was able to sleep about 9 hours last night, which is the most sleep she has had in weeks.   Depression screen [facility] 2/9 07/15/2018 07/07/2018 06/02/2018 06/21/2017 05/10/2017  Decreased Interest 2 1 2 2 1   Down, Depressed, Hopeless 2 2 3 3 1   PHQ - 2 Score 4 3 5 5 2   Altered sleeping 3 3 3 3 3   Tired, decreased energy 3 2 3 3 3   Change in appetite 3 3 3 3 3   Feeling bad or failure about yourself  2 2 2 3 3   Trouble concentrating 2 2 2 2 2   Moving slowly or fidgety/restless 2 1 0 3 3  Suicidal thoughts 1 1 0 3 3  PHQ-9 Score 20 17 18  25 22   Difficult doing work/chores Very difficult Extremely dIfficult Very difficult - -  Some recent data might be hidden    GAD 7 : Generalized Anxiety Score 07/15/2018 07/07/2018 06/21/2017 05/10/2017  Nervous, Anxious, on Edge 3 2 3 3   Control/stop worrying 3 2 3 3   Worry too much - different things 1 2 3 3   Trouble relaxing 3 2 3 3   Restless 1 2 3 2   Easily annoyed or irritable 2 1 3 2   Afraid - awful might happen 1 2 3 3   Total GAD 7 Score 14 13 21 19   Anxiety Difficulty Somewhat difficult Extremely difficult - -      Past Medical History:  Diagnosis Date  . Anxiety   . Asthma   . Chronic back pain   . Depression   . Epidermoid cyst of skin of chest 05/14/2016  . 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 Migraine headache   . Obesity   . Ovarian cyst, left 10/21/2016  2.3 cm left ovarian cystic lesion with indeterminate but probably benign characteristics. Recommend continued followup by transvaginal pelvic ultrasound in 6-12 weeks   Past Surgical History:  Procedure Laterality Date  . BACK SURGERY    . CESAREAN SECTION    . FOOT SURGERY Left    Social History   Tobacco Use  . Smoking status: Never Smoker  . Smokeless tobacco: Never Used  Substance Use Topics  . Alcohol use: Yes    Alcohol/week: 1.0 standard drinks    Types: 1 Glasses of wine per week   family history includes Breast cancer in her maternal aunt and mother.    ROS: negative except as noted in the HPI  Medications: Current Outpatient Medications  Medication Sig Dispense Refill  . atenolol (TENORMIN) 50 MG tablet Take 1 tablet (50 mg total) by mouth at bedtime. 90 tablet 1  . baclofen (LIORESAL) 10 MG tablet Take one tablet (10 mg dose) by mouth 3 (three) times a day as needed.  2  . budesonide (PULMICORT) 180 MCG/ACT inhaler Inhale 1 puff into the lungs 2 (two) times daily. 1 each 3  . buPROPion (WELLBUTRIN XL) 150 MG 24 hr tablet Take 1 tablet (150 mg total) by mouth  daily. 180 tablet 0  . busPIRone (BUSPAR) 7.5 MG tablet Take 2 tablets (15 mg total) by mouth 2 (two) times daily. May take 1 additional dose as needed for panic/anxiety 90 tablet 0  . chlorthalidone (HYGROTON) 25 MG tablet Take 1 tablet (25 mg total) by mouth daily. 90 tablet 0  . EPINEPHrine 0.3 mg/0.3 mL IJ SOAJ injection INJECT AS DIRECTED ONCE AS NEEDED  1  . FLUoxetine HCl 60 MG TABS Take 60 mg by mouth every morning. 90 tablet 0  . gabapentin (NEURONTIN) 300 MG capsule Take 1-2 capsules (300-600 mg total) by mouth at bedtime. 60 capsule 0  . HYDROmorphone (DILAUDID) 4 MG tablet     . hydrOXYzine (ATARAX/VISTARIL) 25 MG tablet TAKE 1 TO 2 TABLETS(25 TO 50 MG) BY MOUTH AT BEDTIME AS NEEDED FOR ITCHING/ANXIETY 90 tablet 1  . ketorolac (TORADOL) 10 MG tablet 1 tablet with food or milk as needed    . levocetirizine (XYZAL) 5 MG tablet Take 1 tablet (5 mg total) by mouth daily. 30 tablet 5  . PROAIR HFA 108 (90 Base) MCG/ACT inhaler INHALE 1 TO 2 PUFFS INTO THE LUNGS EVERY 4 HOURS AS NEEDED FOR WHEEZING OR SHORTNESS OF BREATH 8.5 g 0  . rizatriptan (MAXALT-MLT) 10 MG disintegrating tablet Take 1 tablet (10 mg total) by mouth as needed for migraine. May repeat in 2 hours if needed 18 tablet 1  . acetaminophen (TYLENOL) 325 MG tablet 1 tablet    . Galcanezumab-gnlm 120 MG/ML SOAJ Inject into the skin.     No current facility-administered medications for this visit.    Allergies  Allergen Reactions  . Aspirin Anaphylaxis  . Amlodipine Rash       Objective:  Ht 5\' 3"  (1.6 m)   Wt 230 lb (104.3 kg)   BMI 40.74 kg/m     No results found for this or any previous visit (from the past 72 hour(s)). No results found.    Assessment and Plan: 53 y.o. female with   .Cathy Oconnell was seen today for knee pain.  Diagnoses and all orders for this visit:  Acute pain of left knee  Primary osteoarthritis of left knee   Patient was scheduled with Sports Medicine today for left knee pain No  charge for today's visit  Her 2 week f/u for mood/anxiety was re-scheduled for next week with me    Follow Up Instructions:    I discussed the assessment and treatment plan with the patient. The patient was provided an opportunity to ask questions and all were answered. The patient agreed with the plan and demonstrated an understanding of the instructions.   The patient was advised to call back or seek an in-person evaluation if the symptoms worsen or if the condition fails to improve as anticipated.    Carlis Stable, [place]

## 2018-07-15 NOTE — Progress Notes (Signed)
Subjective:    CC: Left knee pain  HPI: This is a pleasant 53 year old female, she has had worsening pain in her left knee, anteriorly, with moderate gelling.  We did an injection about a year ago with good relief.  She has not been using any NSAIDs, not doing any rehab.  Moderate, persistent, localized.  No mechanical symptoms.  I reviewed the past medical history, family history, social history, surgical history, and allergies today and no changes were needed.  Please see the problem list section below in epic for further details.  Past Medical History: Past Medical History:  Diagnosis Date  . Anxiety   . Asthma   . Chronic back pain   . Depression   . Epidermoid cyst of skin of chest 05/14/2016  . 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 Migraine headache   . Obesity   . Ovarian cyst, left 10/21/2016   2.3 cm left ovarian cystic lesion with indeterminate but probably benign characteristics. Recommend continued followup by transvaginal pelvic ultrasound in 6-12 weeks   Past Surgical History: Past Surgical History:  Procedure Laterality Date  . BACK SURGERY    . CESAREAN SECTION    . FOOT SURGERY Left    Social History: Social History   Socioeconomic History  . Marital status: Married    Spouse name: Not on file  . Number of children: Not on file  . Years of education: Not on file  . Highest education level: Not on file  Occupational History  . Not on file  Social Needs  . Financial resource strain: Not on file  . Food insecurity:    Worry: Not on file    Inability: Not on file  . Transportation needs:    Medical: Not on file    Non-medical: Not on file  Tobacco Use  . Smoking status: Never Smoker  . Smokeless tobacco: Never Used  Substance and Sexual Activity  . Alcohol use: Yes    Alcohol/week: 1.0 standard drinks    Types: 1 Glasses of wine per week  . Drug use: No  . Sexual activity: Yes  Lifestyle  . Physical activity:    Days per  week: Not on file    Minutes per session: Not on file  . Stress: Not on file  Relationships  . Social connections:    Talks on phone: Not on file    Gets together: Not on file    Attends religious service: Not on file    Active member of club or organization: Not on file    Attends meetings of clubs or organizations: Not on file    Relationship status: Not on file  Other Topics Concern  . Not on file  Social History Narrative  . Not on file   Family History: Family History  Problem Relation Age of Onset  . Breast cancer Mother   . Breast cancer Maternal Aunt    Allergies: Allergies  Allergen Reactions  . Aspirin Anaphylaxis  . Amlodipine Rash   Medications: See med rec.  Review of Systems: No fevers, chills, night sweats, weight loss, chest pain, or shortness of breath.   Objective:    General: Well Developed, well nourished, and in no acute distress.  Neuro: Alert and oriented x3, extra-ocular muscles intact, sensation grossly intact.  HEENT: Normocephalic, atraumatic, pupils equal round reactive to light, neck supple, no masses, no lymphadenopathy, thyroid nonpalpable.  Skin: Warm and dry, no rashes. Cardiac: Regular rate and rhythm, no  murmurs rubs or gallops, no lower extremity edema.  Respiratory: Clear to auscultation bilaterally. Not using accessory muscles, speaking in full sentences. Left knee: Swollen, palpable fluid wave with effusion. ROM normal in flexion and extension and lower leg rotation. Ligaments with solid consistent endpoints including ACL, PCL, LCL, MCL. Negative Mcmurray's and provocative meniscal tests. Non painful patellar compression. Patellar and quadriceps tendons unremarkable. Hamstring and quadriceps strength is normal.  Procedure: Real-time Ultrasound Guided aspiration/injection of left knee Device: GE Logiq E  Verbal informed consent obtained.  Time-out conducted.  Noted no overlying erythema, induration, or other signs of local  infection.  Skin prepped in a sterile fashion.  Local anesthesia: Topical Ethyl chloride.  With sterile technique and under real time ultrasound guidance:  Using an 18-gauge needle advanced into the suprapatellar recess, aspirated 5 cc of clear, straw-colored fluid, syringe switched and 1 cc Kenalog 40, 2 cc lidocaine, 2 cc bupivacaine injected easily. Completed without difficulty  Pain immediately resolved suggesting accurate placement of the medication.  Advised to call if fevers/chills, erythema, induration, drainage, or persistent bleeding.  Images permanently stored and available for review in the ultrasound unit.  Impression: Technically successful ultrasound guided injection.  Impression and Recommendations:    Patellofemoral arthritis of left knee Aspiration, injection, meloxicam. Return to see me in a month. Rehab exercises given.   ___________________________________________ Ihor Austin. Benjamin Stain, M.D., ABFM., CAQSM. Primary Care and Sports Medicine [facility]  Adjunct Professor of Family Medicine  [employer]

## 2018-07-17 ENCOUNTER — Encounter: Payer: Self-pay | Admitting: Sports Medicine

## 2018-07-20 ENCOUNTER — Other Ambulatory Visit: Payer: Self-pay | Admitting: Physician Assistant

## 2018-07-20 DIAGNOSIS — L509 Urticaria, unspecified: Secondary | ICD-10-CM

## 2018-07-21 ENCOUNTER — Ambulatory Visit: Payer: BLUE CROSS/BLUE SHIELD | Admitting: Physician Assistant

## 2018-07-22 ENCOUNTER — Ambulatory Visit (INDEPENDENT_AMBULATORY_CARE_PROVIDER_SITE_OTHER): Payer: BLUE CROSS/BLUE SHIELD | Admitting: Physician Assistant

## 2018-07-22 ENCOUNTER — Encounter: Payer: Self-pay | Admitting: Physician Assistant

## 2018-07-22 DIAGNOSIS — F418 Other specified anxiety disorders: Secondary | ICD-10-CM

## 2018-07-22 DIAGNOSIS — F331 Major depressive disorder, recurrent, moderate: Secondary | ICD-10-CM

## 2018-07-22 DIAGNOSIS — F410 Panic disorder [episodic paroxysmal anxiety] without agoraphobia: Secondary | ICD-10-CM | POA: Diagnosis not present

## 2018-07-22 DIAGNOSIS — F430 Acute stress reaction: Secondary | ICD-10-CM | POA: Diagnosis not present

## 2018-07-22 DIAGNOSIS — F411 Generalized anxiety disorder: Secondary | ICD-10-CM | POA: Insufficient documentation

## 2018-07-22 DIAGNOSIS — F5105 Insomnia due to other mental disorder: Secondary | ICD-10-CM

## 2018-07-22 MED ORDER — GABAPENTIN 300 MG PO CAPS: 600.0000 mg | ORAL_CAPSULE | Freq: Every day | ORAL | 0 refills | Status: AC

## 2018-07-22 NOTE — Progress Notes (Signed)
Virtual Visit via Telephone Note  I connected with Cathy Oconnell on 07/22/18 at  1:20 PM EDT by telephone and verified that I am speaking with the correct person using two identifiers.   I discussed the limitations, risks, security and privacy concerns of performing an evaluation and management service by telephone and the availability of in person appointments. I also discussed with the patient that there may be a patient responsible charge related to this service. The patient expressed understanding and agreed to proceed.   History of Present Illness: HPI:                                                                Cathy Oconnell is a 53 y.o. female   CC: anxiety/depression follow-up  07/07/2018 For approximately the last 2 months since the start of the COVID-19 pandemic patient has reported increased depressive and anxiety symptoms Mood - " I feel down" reports increased tearfulness for no apparent reason Anxiety -increased, endorses excessive worry and restlessness Sleep - " horrible," reports she is getting about 2-1/2 to 3-1/2 hours nightly for the last 4 to 5 weeks.  She has been taking Tylenol PM occasionally  At last office visit fluoxetine was increased from 40 mg to 60 mg.  Added buspirone 7 and half milligrams 3 times daily as needed.  She reports she is currently only taking 7-1/2 mg at bedtime.   She is currently furloughed and worried about employment.  She does report that husband is employed and they are not in any sort of financial danger. She lives at home with her husband who is supportive.  She states that she talks to him whenever she is feeling sad or having suicidal thoughts and he helps her through this.  She also states that she has a strong church community and a good relationship with her pastor.  07/22/2018 Mood: "doing better," no suicidal ideations, taking Fluoxetine 60 mg and Wellbutrin XL 150 mg, Singulair was also discontinued  Anxiety:  Increased Buspar to 15 mg bid with optional third dose prn (max 60 mg/day) - she states this has been helping her to relax Sleep: improved, getting solid 8 hrs with Gabapentin 600 mg QHS Stressors: she is in the process of moving, COVID-19 pandemic  Depression screen [facility] 2/9 07/22/2018 07/15/2018 07/07/2018 06/02/2018 06/21/2017  Decreased Interest 1 2 1 2 2   Down, Depressed, Hopeless 1 2 2 3 3   PHQ - 2 Score 2 4 3 5 5   Altered sleeping 3 3 3 3 3   Tired, decreased energy 0 3 2 3 3   Change in appetite 2 3 3 3 3   Feeling bad or failure about yourself  2 2 2 2 3   Trouble concentrating 3 2 2 2 2   Moving slowly or fidgety/restless 0 2 1 0 3  Suicidal thoughts 0 1 1 0 3  PHQ-9 Score 12 20 17 18 25   Difficult doing work/chores - Very difficult Extremely dIfficult Very difficult -  Some recent data might be hidden    GAD 7 : Generalized Anxiety Score 07/22/2018 07/15/2018 07/07/2018 06/21/2017  Nervous, Anxious, on Edge 3 3 2 3   Control/stop worrying 2 3 2 3   Worry too much - different things 3 1 2 3   Trouble relaxing  Restless 0 Easily annoyed or irritable Afraid - awful might happen Total GAD 7 Score Anxiety Difficulty - Somewhat difficult Extremely difficult -      Past Medical History:  Diagnosis Date  . Anxiety   . Asthma   . Chronic back pain   . Depression   . Epidermoid cyst of skin of chest 05/14/2016  . 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 Migraine headache   . Obesity   . Ovarian cyst, left 10/21/2016   2.3 cm left ovarian cystic lesion with indeterminate but probably benign characteristics. Recommend continued followup by transvaginal pelvic ultrasound in 6-12 weeks   Past Surgical History:  Procedure Laterality Date  . BACK SURGERY    . CESAREAN SECTION    . FOOT SURGERY Left    Social History   Tobacco Use  . Smoking status: Never Smoker  . Smokeless tobacco: Never Used  Substance Use Topics  .  Alcohol use: Yes    Alcohol/week: 1.0 standard drinks    Types: 1 Glasses of wine per week   family history includes Breast cancer in her maternal aunt and mother.    ROS: negative except as noted in the HPI  Medications: Current Outpatient Medications  Medication Sig Dispense Refill  . atenolol (TENORMIN) 50 MG tablet Take 1 tablet (50 mg total) by mouth at bedtime. 90 tablet 1  . baclofen (LIORESAL) 10 MG tablet Take one tablet (10 mg dose) by mouth 3 (three) times a day as needed.  2  . budesonide (PULMICORT) 180 MCG/ACT inhaler Inhale 1 puff into the lungs 2 (two) times daily. 1 each 3  . buPROPion (WELLBUTRIN XL) 150 MG 24 hr tablet Take 1 tablet (150 mg total) by mouth daily. 180 tablet 0  . busPIRone (BUSPAR) 7.5 MG tablet Take 2 tablets (15 mg total) by mouth 2 (two) times daily. May take 1 additional dose as needed for panic/anxiety 90 tablet 0  . chlorthalidone (HYGROTON) 25 MG tablet Take 1 tablet (25 mg total) by mouth daily. 90 tablet 0  . EPINEPHrine 0.3 mg/0.3 mL IJ SOAJ injection INJECT AS DIRECTED ONCE AS NEEDED  1  . FLUoxetine HCl 60 MG TABS Take 60 mg by mouth every morning. 90 tablet 0  . gabapentin (NEURONTIN) 300 MG capsule Take 2 capsules (600 mg total) by mouth at bedtime. 60 capsule 0  . Galcanezumab-gnlm 120 MG/ML SOAJ Inject into the skin.    Marland Kitchen HYDROmorphone (DILAUDID) 4 MG tablet     . hydrOXYzine (ATARAX/VISTARIL) 25 MG tablet TAKE 1 TO 2 TABLETS BY MOUTH AT BEDTIME AS NEEDED FOR ITCHING 90 tablet 1  . ketorolac (TORADOL) 10 MG tablet 1 tablet with food or milk as needed    . levocetirizine (XYZAL) 5 MG tablet Take 1 tablet (5 mg total) by mouth daily. 30 tablet 5  . meloxicam (MOBIC) 15 MG tablet One tab PO qAM with breakfast for 2 weeks, then daily prn pain. 30 tablet 3  . PROAIR HFA 108 (90 Base) MCG/ACT inhaler INHALE 1 TO 2 PUFFS INTO THE LUNGS EVERY 4 HOURS AS NEEDED FOR WHEEZING OR SHORTNESS OF BREATH 8.5 g 0  . rizatriptan (MAXALT-MLT) 10 MG  disintegrating tablet Take 1 tablet (10 mg total) by mouth as needed for migraine. May repeat in 2 hours if needed 18 tablet 1  No current facility-administered medications for this visit.    Allergies  Allergen Reactions  . Aspirin Anaphylaxis  . Amlodipine Rash       Objective:  There were no vitals taken for this visit. Neuro: alert and oriented x 3 Psych: cooperative, depressed mood, speech is articulate, normal rate and volume; thought processes clear and goal-directed, normal judgment, good insight    Assessment and Plan: 53 y.o. female with   .[name] was seen today for follow-up.  Diagnoses and all orders for this visit:  Moderate episode of recurrent major depressive disorder (HCC)  Generalized anxiety disorder with panic attacks  Acute stress reaction  Panic disorder -     gabapentin (NEURONTIN) 300 MG capsule; Take 2 capsules (600 mg total) by mouth at bedtime.  Insomnia secondary to depression with anxiety -     gabapentin (NEURONTIN) 300 MG capsule; Take 2 capsules (600 mg total) by mouth at bedtime.    PHQ9=12, improved from 20, no acute safety issues GAD7=15, unchanged Overall [name] is improved and there are some situational stressors still contributing Encouraged her to schedule counseling with her pastor after her move once she is more settled Relaxation/sleep hygiene exercises sent via MyChart Cont current medications Follow-up e-visit in 2 weeks   Follow Up Instructions:    I discussed the assessment and treatment plan with the patient. The patient was provided an opportunity to ask questions and all were answered. The patient agreed with the plan and demonstrated an understanding of the instructions.   The patient was advised to call back or seek an in-person evaluation if the symptoms worsen or if the condition fails to improve as anticipated.  I provided 11-20 minutes of non-face-to-face time during this encounter.   Carlis Stableharley  [place] , [place]

## 2018-07-27 ENCOUNTER — Other Ambulatory Visit: Payer: Self-pay | Admitting: Physician Assistant

## 2018-07-27 ENCOUNTER — Encounter: Payer: Self-pay | Admitting: Physician Assistant

## 2018-07-27 DIAGNOSIS — F331 Major depressive disorder, recurrent, moderate: Secondary | ICD-10-CM

## 2018-07-27 DIAGNOSIS — F410 Panic disorder [episodic paroxysmal anxiety] without agoraphobia: Secondary | ICD-10-CM

## 2018-07-27 MED ORDER — BUSPIRONE HCL 7.5 MG PO TABS: 15.0000 mg | ORAL_TABLET | Freq: Two times a day (BID) | ORAL | 0 refills | Status: DC

## 2018-07-28 MED ORDER — BUSPIRONE HCL 15 MG PO TABS: 15.0000 mg | ORAL_TABLET | Freq: Two times a day (BID) | ORAL | 1 refills | Status: DC

## 2018-08-04 ENCOUNTER — Encounter: Payer: Self-pay | Admitting: Physician Assistant

## 2018-08-04 ENCOUNTER — Ambulatory Visit (INDEPENDENT_AMBULATORY_CARE_PROVIDER_SITE_OTHER): Payer: BC Managed Care – PPO | Admitting: Physician Assistant

## 2018-08-04 DIAGNOSIS — F411 Generalized anxiety disorder: Secondary | ICD-10-CM | POA: Diagnosis not present

## 2018-08-04 DIAGNOSIS — Z87892 Personal history of anaphylaxis: Secondary | ICD-10-CM

## 2018-08-04 DIAGNOSIS — F3341 Major depressive disorder, recurrent, in partial remission: Secondary | ICD-10-CM | POA: Diagnosis not present

## 2018-08-04 DIAGNOSIS — F410 Panic disorder [episodic paroxysmal anxiety] without agoraphobia: Secondary | ICD-10-CM | POA: Diagnosis not present

## 2018-08-04 MED ORDER — EPINEPHRINE 0.3 MG/0.3ML IJ SOAJ: INTRAMUSCULAR | 1 refills | Status: AC

## 2018-08-04 NOTE — Progress Notes (Signed)
Virtual Visit via Telephone Note  I connected with Cathy Oconnell on 08/04/18 at  3:00 PM EDT by telephone and verified that I am speaking with the correct person using two identifiers.   I discussed the limitations, risks, security and privacy concerns of performing an evaluation and management service by telephone and the availability of in person appointments. I also discussed with the patient that there may be a patient responsible charge related to this service. The patient expressed understanding and agreed to proceed.   History of Present Illness: HPI:                                                                Cathy Oconnell is a 53 y.o. female   CC: depression/follow-up  07/07/2018 For approximately the last 2 months since the start of the COVID-19 pandemic patient has reported increased depressive and anxiety symptoms Mood - "I feel down"reports increased tearfulness for no apparent reason Anxiety-increased, endorses excessive worry and restlessness Sleep- "horrible,"reports she is getting about 2-1/2 to 3-1/2 hours nightly for the last 4 to 5 weeks.She has been taking Tylenol PM occasionally  At last office visit fluoxetine was increased from 40 mg to 60 mg. Added buspirone 7 and half milligrams 3 times daily as needed. She reports she is currently only taking 7-1/2 mg at bedtime.  She is currently furloughed and worried about employment. She does report that husband is employed and they are not in any sort of financial danger. She lives at home with her husband who is supportive. She states that she talks to him whenever she is feeling sad or having suicidal thoughts and he helps her through this. She also states that she has a strong church community and a good relationship with her pastor.  07/22/2018 Mood: "doing better," no suicidal ideations, taking Fluoxetine 60 mg and Wellbutrin XL 150 mg, Singulair was also discontinued  Anxiety: Increased  Buspar to 15 mg bid with optional third dose prn (max 60 mg/day) - she states this has been helping her to relax Sleep: improved, getting solid 8 hrs with Gabapentin 600 mg QHS Stressors: she is in the process of moving, COVID-19 pandemic  08/04/2018 Mood: "really good, most days are good, not feeling down a whole lot" Anxiety: "mini panic attacks" in the evenings. She attributes this to staying up too late Sleep: she has been practicing sleep hygiene, winding down better. Still getting about 8 hours Current meds: Wellbutrin XL 150 mg QAM, Buspirone 15 mg bid, Fluoxetine 60 mg QD, Gabapentin 600 mg QHS [facility]04/2018 07/22/2018 07/15/2018 07/07/2018 06/02/2018  Decreased Interest 1 1 2 1 2   Down, Depressed, Hopeless 1 1 2 2 3   PHQ - 2 Score 2 2 4 3 5   Altered sleeping 1 3 3 3 3   Tired, decreased energy 0 0 3 2 3   Change in appetite 0 2 3 3 3   Feeling bad or failure about yourself  1 2 2 2 2   Trouble concentrating 0 3 2 2 2   Moving slowly or fidgety/restless 0 0 2 1 0  Suicidal thoughts 0 0 1 1 0  PHQ-9 Score 4 12 20 17 18   Difficult doing work/chores - - Very difficult Extremely dIfficult Very difficult  Some  recent data might be hidden    GAD 7 : Generalized Anxiety Score 08/04/2018 07/22/2018 07/15/2018 07/07/2018  Nervous, Anxious, on Edge 2 3 3 2   Control/stop worrying 2 2 3 2   Worry too much - different things 1 3 1 2   Trouble relaxing 1 3 3 2   Restless 2 0 1 2  Easily annoyed or irritable 1 2 2 1   Afraid - awful might happen 2 2 1 2   Total GAD 7 Score 11 15 14 13   Anxiety Difficulty - - Somewhat difficult Extremely difficult      Past Medical History:  Diagnosis Date  . Anxiety   . Asthma   . Chronic back pain   . Depression   . Epidermoid cyst of skin of chest 05/14/2016  . 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 Migraine headache   . Obesity   . Ovarian cyst, left 10/21/2016   2.3 cm left ovarian cystic lesion with indeterminate  but probably benign characteristics. Recommend continued followup by transvaginal pelvic ultrasound in 6-12 weeks   Past Surgical History:  Procedure Laterality Date  . BACK SURGERY    . CESAREAN SECTION    . FOOT SURGERY Left    Social History   Tobacco Use  . Smoking status: Never Smoker  . Smokeless tobacco: Never Used  Substance Use Topics  . Alcohol use: Yes    Alcohol/week: 1.0 standard drinks    Types: 1 Glasses of wine per week   family history includes Breast cancer in her maternal aunt and mother.    ROS: negative except as noted in the HPI  Medications: Current Outpatient Medications  Medication Sig Dispense Refill  . atenolol (TENORMIN) 50 MG tablet Take 1 tablet (50 mg total) by mouth at bedtime. 90 tablet 1  . baclofen (LIORESAL) 10 MG tablet Take one tablet (10 mg dose) by mouth 3 (three) times a day as needed.  2  . budesonide (PULMICORT) 180 MCG/ACT inhaler Inhale 1 puff into the lungs 2 (two) times daily. 1 each 3  . buPROPion (WELLBUTRIN XL) 150 MG 24 hr tablet Take 1 tablet (150 mg total) by mouth daily. 180 tablet 0  . busPIRone (BUSPAR) 15 MG tablet Take 1 tablet (15 mg total) by mouth 2 (two) times daily. May take 1 additional dose every 8 hr as needed for panic/anxiety 220 tablet 1  . chlorthalidone (HYGROTON) 25 MG tablet Take 1 tablet (25 mg total) by mouth daily. 90 tablet 0  . EPINEPHrine 0.3 mg/0.3 mL IJ SOAJ injection INJECT AS DIRECTED ONCE AS NEEDED 2 Device 1  . FLUoxetine HCl 60 MG TABS Take 60 mg by mouth every morning. 90 tablet 0  . gabapentin (NEURONTIN) 300 MG capsule Take 2 capsules (600 mg total) by mouth at bedtime. 60 capsule 0  . HYDROmorphone (DILAUDID) 4 MG tablet     . hydrOXYzine (ATARAX/VISTARIL) 25 MG tablet TAKE 1 TO 2 TABLETS BY MOUTH AT BEDTIME AS NEEDED FOR ITCHING 90 tablet 1  . ketorolac (TORADOL) 10 MG tablet 1 tablet with food or milk as needed    . meloxicam (MOBIC) 15 MG tablet One tab PO qAM with breakfast for 2  weeks, then daily prn pain. 30 tablet 3  . PROAIR HFA 108 (90 Base) MCG/ACT inhaler INHALE 1 TO 2 PUFFS INTO THE LUNGS EVERY 4 HOURS AS NEEDED FOR WHEEZING OR SHORTNESS OF BREATH 8.5 g 0  . rizatriptan (MAXALT-MLT) 10 MG disintegrating tablet Take 1 tablet (  10 mg total) by mouth as needed for migraine. May repeat in 2 hours if needed 18 tablet 1  . Galcanezumab-gnlm 120 MG/ML SOAJ Inject into the skin.    Marland Kitchen. levocetirizine (XYZAL) 5 MG tablet Take 1 tablet (5 mg total) by mouth daily. (Patient not taking: Reported on 08/04/2018) 30 tablet 5   No current facility-administered medications for this visit.    Allergies  Allergen Reactions  . Aspirin Anaphylaxis  . Amlodipine Rash       Objective:  There were no vitals taken for this visit. Neuro: alert and oriented x 3 Psych: cooperative, euthymic mood, affect mood-congruent, speech is articulate, normal rate and volume; thought processes clear and goal-directed, normal judgment, good insight, no SI  BP Readings from Last 3 Encounters:  07/15/18 127/77  07/07/18 (!) 161/90  06/02/18 131/90    Pulse Readings from Last 3 Encounters:  07/15/18 91  07/07/18 77  05/02/18 75   Wt Readings from Last 3 Encounters:  07/15/18 255 lb (115.7 kg)  07/15/18 230 lb (104.3 kg)  07/07/18 258 lb (117 kg)     No results found for this or any previous visit (from the past 72 hour(s)). No results found.    Assessment and Plan: 53 y.o. female with   .Cathy Oconnell was seen today for follow-up.  Diagnoses and all orders for this visit:  History of drug-induced anaphylaxis -     EPINEPHrine 0.3 mg/0.3 mL IJ SOAJ injection; INJECT AS DIRECTED ONCE AS NEEDED  Recurrent major depressive disorder, in partial remission (HCC)  Generalized anxiety disorder with panic attacks   PHQ9=4 GAD7=11 Partial remission  Cont current meds: Wellbutrin XL 150 mg QAM, Buspirone 15 mg bid, Fluoxetine 60 mg QD, Gabapentin 600 mg QHS  Follow-up in office in  2-4 months for CPE w/fasting labs and Pap  Follow Up Instructions:    I discussed the assessment and treatment plan with the patient. The patient was provided an opportunity to ask questions and all were answered. The patient agreed with the plan and demonstrated an understanding of the instructions.   The patient was advised to call back or seek an in-person evaluation if the symptoms worsen or if the condition fails to improve as anticipated.  I provided 5-10 minutes of non-face-to-face time during this encounter.   Carlis Stableharley Elizabeth Talley Kreiser, [place]-C

## 2018-08-05 ENCOUNTER — Ambulatory Visit: Payer: BLUE CROSS/BLUE SHIELD | Admitting: Physician Assistant

## 2018-08-05 ENCOUNTER — Encounter: Payer: Self-pay | Admitting: Physician Assistant

## 2018-08-12 ENCOUNTER — Ambulatory Visit: Payer: BC Managed Care – PPO | Admitting: Sports Medicine

## 2018-08-31 DIAGNOSIS — M5136 Other intervertebral disc degeneration, lumbar region: Secondary | ICD-10-CM | POA: Diagnosis not present

## 2018-08-31 DIAGNOSIS — Z79891 Long term (current) use of opiate analgesic: Secondary | ICD-10-CM | POA: Diagnosis not present

## 2018-08-31 DIAGNOSIS — M4716 Other spondylosis with myelopathy, lumbar region: Secondary | ICD-10-CM | POA: Diagnosis not present

## 2018-08-31 DIAGNOSIS — M5416 Radiculopathy, lumbar region: Secondary | ICD-10-CM | POA: Diagnosis not present

## 2018-08-31 DIAGNOSIS — G894 Chronic pain syndrome: Secondary | ICD-10-CM | POA: Diagnosis not present

## 2018-08-31 DIAGNOSIS — M791 Myalgia, unspecified site: Secondary | ICD-10-CM | POA: Diagnosis not present

## 2018-09-01 ENCOUNTER — Encounter: Payer: Self-pay | Admitting: Physician Assistant

## 2018-09-02 ENCOUNTER — Encounter: Payer: Self-pay | Admitting: Physician Assistant

## 2018-09-06 ENCOUNTER — Telehealth: Payer: BC Managed Care – PPO | Admitting: Family

## 2018-09-06 DIAGNOSIS — M5442 Lumbago with sciatica, left side: Secondary | ICD-10-CM | POA: Diagnosis not present

## 2018-09-06 MED ORDER — CYCLOBENZAPRINE HCL 10 MG PO TABS: 10.0000 mg | ORAL_TABLET | Freq: Three times a day (TID) | ORAL | 0 refills | Status: AC | PRN

## 2018-09-06 NOTE — Progress Notes (Signed)
We are sorry that you are not feeling well.  Here is how we plan to help!  Based on what you have shared with me it looks like you mostly have acute back pain.  Acute back pain is defined as musculoskeletal pain that can resolve in 1-3 weeks with conservative treatment.  I have prescribed  Flexeril 10 mg every eight hours as needed which is a muscle relaxer  You are allergic to Apsirin so we have to be careful prescribing NSAID medications like Ibuprofen. Please keep in mind that muscle relaxer's can cause fatigue and should not be taken while at work or driving.  Back pain is very common.  The pain often gets better over time.  The cause of back pain is usually not dangerous.  Most people can learn to manage their back pain on their own.  Home Care  Stay active.  Start with short walks on flat ground if you can.  Try to walk farther each day.  Do not sit, drive or stand in one place for more than 30 minutes.  Do not stay in bed.  Do not avoid exercise or work.  Activity can help your back heal faster.  Be careful when you bend or lift an object.  Bend at your knees, keep the object close to you, and do not twist.  Sleep on a firm mattress.  Lie on your side, and bend your knees.  If you lie on your back, put a pillow under your knees.  Only take medicines as told by your doctor.  Put ice on the injured area.  Put ice in a plastic bag  Place a towel between your skin and the bag  Leave the ice on for 15-20 minutes, 3-4 times a day for the first 2-3 days. 210 After that, you can switch between ice and heat packs.  Ask your doctor about back exercises or massage.  Avoid feeling anxious or stressed.  Find good ways to deal with stress, such as exercise.  Get Help Right Way If:  Your pain does not go away with rest or medicine.  Your pain does not go away in 1 week.  You have new problems.  You do not feel well.  The pain spreads into your legs.  You cannot control when you  poop (bowel movement) or pee (urinate)  You feel sick to your stomach (nauseous) or throw up (vomit)  You have belly (abdominal) pain.  You feel like you may pass out (faint).  If you develop a fever.  Make Sure you:  Understand these instructions.  Will watch your condition  Will get help right away if you are not doing well or get worse.  Your e-visit answers were reviewed by a board certified advanced clinical practitioner to complete your personal care plan.  Depending on the condition, your plan could have included both over the counter or prescription medications.  If there is a problem please reply  once you have received a response from your provider.  Your safety is important to us.  If you have drug allergies check your prescription carefully.    You can use MyChart to ask questions about today's visit, request a non-urgent call back, or ask for a work or school excuse for 24 hours related to this e-Visit. If it has been greater than 24 hours you will need to follow up with your provider, or enter a new e-Visit to address those concerns.  You will get an e-mail  in the next two days asking about your experience.  I hope that your e-visit has been valuable and will speed your recovery. Thank you for using e-visits.  Greater than 5 minutes, yet less than 10 minutes of time have been spent researching, coordinating, and implementing care for this patient today.  Thank you for the details you included in the comment boxes. Those details are very helpful in determining the best course of treatment for you and help [place] to provide the best care.

## 2018-09-07 ENCOUNTER — Encounter: Payer: Self-pay | Admitting: Physician Assistant

## 2018-09-08 ENCOUNTER — Encounter: Payer: Self-pay | Admitting: Physician Assistant

## 2018-09-08 ENCOUNTER — Telehealth (INDEPENDENT_AMBULATORY_CARE_PROVIDER_SITE_OTHER): Payer: BC Managed Care – PPO | Admitting: Physician Assistant

## 2018-09-08 ENCOUNTER — Other Ambulatory Visit: Payer: Self-pay

## 2018-09-08 VITALS — Wt 230.0 lb

## 2018-09-08 DIAGNOSIS — Z713 Dietary counseling and surveillance: Secondary | ICD-10-CM

## 2018-09-08 NOTE — Progress Notes (Signed)
Virtual Visit via Video Note  I connected with [name] on 09/08/18 at  4:00 PM EDT by a video enabled telemedicine application and verified that I am speaking with the correct person using two identifiers.   I discussed the limitations of evaluation and management by telemedicine and the availability of in person appointments. The patient expressed understanding and agreed to proceed.  History of Present Illness: HPI:                                                                Cathy Oconnell is a 53 y.o. female   CC: weight management  Current weight 230 lb  Highest adult weight: 255 lb Healthiest adult weight: 110 lb (last weighed 1994) Reports she gained most weight during her pregnancies (1996) and never was able to lose it with steady gradual weight gain since that time.  Prior weight loss attempts: Weight Watchers and another program >10 years ago  Current activity level - lightly active, household chores, yoga 3 days per week  Current dietary problem areas - carb craving - skipping meals - frequent snacking - binging - eating foods high in fat - eating out at restaurants - eating out of boredom - eating before bedtime  She feels very frustrated by her weight and is very motivated to lose weight. Motivations include: - improve health and live longer - improve mobility - feel better, more energy - look better, look more attractive to partner    Past Medical History:  Diagnosis Date  . Anxiety   . Asthma   . Chronic back pain   . Depression   . Epidermoid cyst of skin of chest 05/14/2016  . 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 Migraine headache   . Obesity   . Ovarian cyst, left 10/21/2016   2.3 cm left ovarian cystic lesion with indeterminate but probably benign characteristics. Recommend continued followup by transvaginal pelvic ultrasound in 6-12 weeks   Past Surgical History:  Procedure Laterality Date  . BACK SURGERY     . CESAREAN SECTION    . FOOT SURGERY Left    Social History   Tobacco Use  . Smoking status: Never Smoker  . Smokeless tobacco: Never Used  Substance Use Topics  . Alcohol use: Yes    Alcohol/week: 1.0 standard drinks    Types: 1 Glasses of wine per week   family history includes Breast cancer in her maternal aunt and mother.    ROS: negative except as noted in the HPI  Medications: Current Outpatient Medications  Medication Sig Dispense Refill  . atenolol (TENORMIN) 50 MG tablet Take 1 tablet (50 mg total) by mouth at bedtime. 90 tablet 1  . baclofen (LIORESAL) 10 MG tablet Take one tablet (10 mg dose) by mouth 3 (three) times a day as needed.  2  . budesonide (PULMICORT) 180 MCG/ACT inhaler Inhale 1 puff into the lungs 2 (two) times daily. 1 each 3  . buPROPion (WELLBUTRIN XL) 150 MG 24 hr tablet Take 1 tablet (150 mg total) by mouth daily. 180 tablet 0  . busPIRone (BUSPAR) 15 MG tablet Take 1 tablet (15 mg total) by mouth 2 (two) times daily. May take 1 additional dose every 8 hr as needed  for panic/anxiety 220 tablet 1  . chlorthalidone (HYGROTON) 25 MG tablet Take 1 tablet (25 mg total) by mouth daily. 90 tablet 0  . cyclobenzaprine (FLEXERIL) 10 MG tablet Take 1 tablet (10 mg total) by mouth 3 (three) times daily as needed for muscle spasms. 30 tablet 0  . EPINEPHrine 0.3 mg/0.3 mL IJ SOAJ injection INJECT AS DIRECTED ONCE AS NEEDED 2 Device 1  . FLUoxetine HCl 60 MG TABS Take 60 mg by mouth every morning. 90 tablet 0  . gabapentin (NEURONTIN) 300 MG capsule Take 2 capsules (600 mg total) by mouth at bedtime. 60 capsule 0  . Galcanezumab-gnlm 120 MG/ML SOAJ Inject into the skin.    Marland Kitchen. HYDROmorphone (DILAUDID) 4 MG tablet     . hydrOXYzine (ATARAX/VISTARIL) 25 MG tablet TAKE 1 TO 2 TABLETS BY MOUTH AT BEDTIME AS NEEDED FOR ITCHING 90 tablet 1  . ketorolac (TORADOL) 10 MG tablet 1 tablet with food or milk as needed    . levocetirizine (XYZAL) 5 MG tablet Take 1 tablet (5  mg total) by mouth daily. 30 tablet 5  . meloxicam (MOBIC) 15 MG tablet One tab PO qAM with breakfast for 2 weeks, then daily prn pain. 30 tablet 3  . PROAIR HFA 108 (90 Base) MCG/ACT inhaler INHALE 1 TO 2 PUFFS INTO THE LUNGS EVERY 4 HOURS AS NEEDED FOR WHEEZING OR SHORTNESS OF BREATH 8.5 g 0  . rizatriptan (MAXALT-MLT) 10 MG disintegrating tablet Take 1 tablet (10 mg total) by mouth as needed for migraine. May repeat in 2 hours if needed 18 tablet 1   No current facility-administered medications for this visit.    Allergies  Allergen Reactions  . Aspirin Anaphylaxis  . Amlodipine Rash       Objective:  Wt 230 lb (104.3 kg)   BMI 40.74 kg/m  Wt Readings from Last 3 Encounters:  09/08/18 230 lb (104.3 kg)  07/15/18 255 lb (115.7 kg)  07/15/18 230 lb (104.3 kg)   Temp Readings from Last 3 Encounters:  07/07/18 97.7 F (36.5 C) (Oral)  05/02/18 97.8 F (36.6 C) (Oral)  04/18/18 97.6 F (36.4 C) (Oral)   BP Readings from Last 3 Encounters:  07/15/18 127/77  07/07/18 (!) 161/90  06/02/18 131/90   Pulse Readings from Last 3 Encounters:  07/15/18 91  07/07/18 77  05/02/18 75      No results found for this or any previous visit (from the past 72 hour(s)). No results found.    Assessment and Plan: 53 y.o. female with   .Cathy Oconnell was seen today for weight check.  Diagnoses and all orders for this visit:  Encounter for weight loss counseling   Diet: discussed 1400-1600 calorie eating plan, discussed making meals balanced with a protein, whole grain, fruit and vegetable, discussed measuring foods by reading serving size and using food scale when needed, discussed logging all food/drink at least 5 days per week using MyFitnessPal Exercise: continue yoga, add aerobic activity such as Vinyasa Flow Behavior: discussed strategies to curb late night snacking and healthy snack options Pharmacologic agents: none, patient deferred  Follow-up in 2 weeks for e-visit     Follow Up Instructions:    I discussed the assessment and treatment plan with the patient. The patient was provided an opportunity to ask questions and all were answered. The patient agreed with the plan and demonstrated an understanding of the instructions.   The patient was advised to call back or seek an in-person evaluation if the symptoms  worsen or if the condition fails to improve as anticipated.  I provided 15 minutes of non-face-to-face time during this encounter.   Carlis Stableharley Elizabeth Fedra Lanter, [place]PA-C

## 2018-09-12 ENCOUNTER — Encounter: Payer: Self-pay | Admitting: Sports Medicine

## 2018-09-13 ENCOUNTER — Ambulatory Visit (INDEPENDENT_AMBULATORY_CARE_PROVIDER_SITE_OTHER): Payer: BC Managed Care – PPO

## 2018-09-13 ENCOUNTER — Ambulatory Visit (INDEPENDENT_AMBULATORY_CARE_PROVIDER_SITE_OTHER): Payer: BC Managed Care – PPO | Admitting: Sports Medicine

## 2018-09-13 ENCOUNTER — Other Ambulatory Visit: Payer: Self-pay

## 2018-09-13 DIAGNOSIS — M1712 Unilateral primary osteoarthritis, left knee: Secondary | ICD-10-CM

## 2018-09-13 DIAGNOSIS — M1711 Unilateral primary osteoarthritis, right knee: Secondary | ICD-10-CM | POA: Diagnosis not present

## 2018-09-13 MED ORDER — HYDROCODONE-ACETAMINOPHEN 10-325 MG PO TABS: 1.0000 | ORAL_TABLET | Freq: Three times a day (TID) | ORAL | 0 refills | Status: AC | PRN

## 2018-09-13 NOTE — Progress Notes (Signed)
Subjective:    CC: Left knee pain  HPI: Cathy Oconnell returns, she is a pleasant 53 year old female with chronic pain, currently under pain contract with Dr. Burtis Junesarsha Garvin at [facility].  We have been treating her for left knee osteoarthritis, she has mostly patellofemoral osteoarthritis worse with sitting, squatting, going up and down stairs.  She had a steroid injection 2 months ago without much improvement.  No mechanical symptoms, she is agreeable to proceed with Visco supplementation  I reviewed the past medical history, family history, social history, surgical history, and allergies today and no changes were needed.  Please see the problem list section below in epic for further details.  Past Medical History: Past Medical History:  Diagnosis Date  . Anxiety   . Asthma   . Chronic back pain   . Depression   . Epidermoid cyst of skin of chest 05/14/2016  . 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 Migraine headache   . Obesity   . Ovarian cyst, left 10/21/2016   2.3 cm left ovarian cystic lesion with indeterminate but probably benign characteristics. Recommend continued followup by transvaginal pelvic ultrasound in 6-12 weeks   Past Surgical History: Past Surgical History:  Procedure Laterality Date  . BACK SURGERY    . CESAREAN SECTION    . FOOT SURGERY Left    Social History: Social History   Socioeconomic History  . Marital status: Married    Spouse name: Not on file  . Number of children: Not on file  . Years of education: Not on file  . Highest education level: Not on file  Occupational History  . Not on file  Social Needs  . Financial resource strain: Not on file  . Food insecurity    Worry: Not on file    Inability: Not on file  . Transportation needs    Medical: Not on file    Non-medical: Not on file  Tobacco Use  . Smoking status: Never Smoker  . Smokeless tobacco: Never Used  Substance and Sexual Activity  .  Alcohol use: Yes    Alcohol/week: 1.0 standard drinks    Types: 1 Glasses of wine per week  . Drug use: No  . Sexual activity: Yes  Lifestyle  . Physical activity    Days per week: Not on file    Minutes per session: Not on file  . Stress: Not on file  Relationships  . Social Musicianconnections    Talks on phone: Not on file    Gets together: Not on file    Attends religious service: Not on file    Active member of club or organization: Not on file    Attends meetings of clubs or organizations: Not on file    Relationship status: Not on file  Other Topics Concern  . Not on file  Social History Narrative  . Not on file   Family History: Family History  Problem Relation Age of Onset  . Breast cancer Mother   . Breast cancer Maternal Aunt    Allergies: Allergies  Allergen Reactions  . Aspirin Anaphylaxis  . Amlodipine Rash   Medications: See med rec.  Review of Systems: No fevers, chills, night sweats, weight loss, chest pain, or shortness of breath.   Objective:    General: Well Developed, well nourished, and in no acute distress.  Neuro: Alert and oriented x3, extra-ocular muscles intact, sensation grossly intact.  HEENT: Normocephalic, atraumatic, pupils equal round reactive to light,  neck supple, no masses, no lymphadenopathy, thyroid nonpalpable.  Skin: Warm and dry, no rashes. Cardiac: Regular rate and rhythm, no murmurs rubs or gallops, no lower extremity edema.  Respiratory: Clear to auscultation bilaterally. Not using accessory muscles, speaking in full sentences. Left knee: Normal to inspection with no erythema or effusion or obvious bony abnormalities. Tender at the patellar facets ROM normal in flexion and extension and lower leg rotation. Ligaments with solid consistent endpoints including ACL, PCL, LCL, MCL. Negative Mcmurray's and provocative meniscal tests. Non painful patellar compression. Patellar and quadriceps tendons unremarkable. Hamstring and  quadriceps strength is normal.  Impression and Recommendations:    Patellofemoral arthritis of left knee No improvement with steroid injection, we are going to get her approved for Visco supplementation, ultimately she does need to lose a lot of weight, we can always proceed with patellofemoral replacement if nothing else works. I do want updated x-rays today, and I am going to give her a bit of hydrocodone. I will send a note to Dr. Selinda Orion said that this does not interfere with her pain contract.   ___________________________________________ Gwen Her. Dianah Field, M.D., ABFM., CAQSM. Primary Care and Sports Medicine [facility]  Adjunct Professor of [place] [employer] [facility] [employer]

## 2018-09-13 NOTE — Assessment & Plan Note (Signed)
No improvement with steroid injection, we are going to get her approved for Visco supplementation, ultimately she does need to lose a lot of weight, we can always proceed with patellofemoral replacement if nothing else works. I do want updated x-rays today, and I am going to give her a bit of hydrocodone. I will send a note to Dr. Selinda Orion said that this does not interfere with her pain contract.

## 2018-09-14 ENCOUNTER — Encounter: Payer: Self-pay | Admitting: Sports Medicine

## 2018-09-15 ENCOUNTER — Telehealth: Payer: Self-pay | Admitting: Sports Medicine

## 2018-09-15 ENCOUNTER — Encounter: Payer: Self-pay | Admitting: Sports Medicine

## 2018-09-15 NOTE — Telephone Encounter (Signed)
-----   Message from Tasia Catchings, [place] sent at 09/15/2018  3:13 PM EDT ----- Information has been submitted to [facility] and awaiting determination.   ----- Message ----- From: Silverio Decamp, MD Sent: 09/13/2018   2:40 PM EDT To: Tasia Catchings, CMA  [facility] approval please, left knee, patellofemoral osteoarthritis, x-rays for confirmation, failed steroid injections, NSAIDs. ___________________________________________Thomas Lenna Sciara. Dianah Field, M.D., ABFM., CAQSM.[facility] Care and Sports MedicineC[facility]Center KernersvilleAdjunct Professor of [place] of [facility] [employer]

## 2018-09-17 ENCOUNTER — Telehealth: Payer: BC Managed Care – PPO | Admitting: Physician Assistant

## 2018-09-17 DIAGNOSIS — T7840XA Allergy, unspecified, initial encounter: Secondary | ICD-10-CM

## 2018-09-17 NOTE — Progress Notes (Signed)
I have spent 5 minutes in review of e-visit questionnaire, review and updating patient chart, medical decision making and response to patient.   Sharifa Bucholz Cody Phyllistine Domingos, PA-C    

## 2018-09-17 NOTE — Progress Notes (Signed)
E Visit for Rash  We are sorry that you are not feeling well. Here is how we plan to help!   I recommend you take Benadryl 25 mg - 50 mg every 4 hours to control the symptoms but if they last over 24 hours it is best that you see an office based provider for follow up.      HOME CARE:   Take cool showers and avoid direct sunlight.  Apply cool compress or wet dressings.  Take a bath in an oatmeal bath.  Sprinkle content of one Aveeno packet under running faucet with comfortably warm water.  Bathe for 15-20 minutes, 1-2 times daily.  Pat dry with a towel. Do not rub the rash.  Use hydrocortisone cream.  Take an antihistamine like Benadryl for widespread rashes that itch.  The adult dose of Benadryl is 25-50 mg by mouth 4 times daily.  Caution:  This type of medication may cause sleepiness.  Do not drink alcohol, drive, or operate dangerous machinery while taking antihistamines.  Do not take these medications if you have prostate enlargement.  Read package instructions thoroughly on all medications that you take.  GET HELP RIGHT AWAY IF:   Symptoms don't go away after treatment.  Severe itching that persists.  If you rash spreads or swells.  If you rash begins to smell.  If it blisters and opens or develops a yellow-brown crust.  You develop a fever.  You have a sore throat.  You become short of breath.  MAKE SURE YOU:  Understand these instructions. Will watch your condition. Will get help right away if you are not doing well or get worse.  Thank you for choosing an e-visit. Your e-visit answers were reviewed by a board certified advanced clinical practitioner to complete your personal care plan. Depending upon the condition, your plan could have included both over the counter or prescription medications. Please review your pharmacy choice. Be sure that the pharmacy you have chosen is open so that you can pick up your prescription now.  If there is a problem you may  message your provider in MyChart to have the prescription routed to another pharmacy. Your safety is important to us. If you have drug allergies check your prescription carefully.  For the next 24 hours, you can use MyChart to ask questions about today's visit, request a non-urgent call back, or ask for a work or school excuse from your e-visit provider. You will get an email in the next two days asking about your experience. I hope that your e-visit has been valuable and will speed your recovery.      

## 2018-09-20 ENCOUNTER — Encounter: Payer: Self-pay | Admitting: Sports Medicine

## 2018-09-21 ENCOUNTER — Other Ambulatory Visit: Payer: Self-pay | Admitting: Family

## 2018-09-22 ENCOUNTER — Telehealth: Payer: BC Managed Care – PPO | Admitting: Physician Assistant

## 2018-09-22 ENCOUNTER — Encounter: Payer: Self-pay | Admitting: Physician Assistant

## 2018-09-28 NOTE — Telephone Encounter (Signed)
Insurance requested additional information and waiting on a response.

## 2018-09-28 NOTE — Telephone Encounter (Signed)
[facility] has been approved from 08/29/2018 through 11/02/2018. Waiting on speciality pharmacy to contact for a shipment.

## 2018-10-01 NOTE — Progress Notes (Signed)
Patient No Show.

## 2018-10-04 ENCOUNTER — Encounter: Payer: Self-pay | Admitting: Sports Medicine

## 2018-10-04 NOTE — Telephone Encounter (Signed)
09/30/2018 10:49 AM No Subject Posted by: [facility]  SP Follow-up in Process : Contacted [facility] Regarding Fulfillment - Patient Decision Pending  Contacted [facility] and spoke with rep Manuela Schwartz - they have been trying to reach the patient but have not received a response - SP will continue to try but patient can also call [phone] to confirm shipment with SP.  09/27/2018 09:49 AM No Subject Posted by: [facility]  SP Follow-up in Process : Contacted [facility] Regarding Fulfillment - Prescriber Script Clarification Pending  Contacted [facility] at [phone] and spoke with rep Gerald Stabs - shipment is pending due to not having a PA on file. PA can be initiated by calling [phone]. Emailed Barnet Pall from [facility] to inform her of this. Setting date to follow up.  09/22/2018 10:29 AM No Subject Posted by: [facility]  SP Follow-up in Process : Contacted [facility] Regarding Fulfillment - Prescriber Script Clarification Pending  Contacted [facility] at [phone] and spoke with rep Brandy. Shipment is pending prescriber script approval. Theadora Rama stated that a message was left at office. Setting date to follow up.  This is per [facility].   I have also left a message for the patient as well to call the speciality pharmacy to get the shipment set up.

## 2018-10-06 ENCOUNTER — Other Ambulatory Visit: Payer: Self-pay | Admitting: Physician Assistant

## 2018-10-06 DIAGNOSIS — I10 Essential (primary) hypertension: Secondary | ICD-10-CM

## 2018-10-06 NOTE — Telephone Encounter (Signed)
Case has been closed since patient has not called the speciality pharmacy.

## 2018-10-19 ENCOUNTER — Ambulatory Visit (INDEPENDENT_AMBULATORY_CARE_PROVIDER_SITE_OTHER): Payer: BC Managed Care – PPO

## 2018-10-19 ENCOUNTER — Other Ambulatory Visit: Payer: Self-pay

## 2018-10-19 DIAGNOSIS — Z1231 Encounter for screening mammogram for malignant neoplasm of breast: Secondary | ICD-10-CM | POA: Diagnosis not present

## 2018-10-30 ENCOUNTER — Other Ambulatory Visit: Payer: Self-pay | Admitting: Physician Assistant

## 2018-10-30 DIAGNOSIS — F410 Panic disorder [episodic paroxysmal anxiety] without agoraphobia: Secondary | ICD-10-CM

## 2018-10-30 DIAGNOSIS — F331 Major depressive disorder, recurrent, moderate: Secondary | ICD-10-CM

## 2018-11-01 ENCOUNTER — Encounter: Payer: BC Managed Care – PPO | Admitting: Physician Assistant

## 2018-11-03 DIAGNOSIS — M5136 Other intervertebral disc degeneration, lumbar region: Secondary | ICD-10-CM | POA: Diagnosis not present

## 2018-11-03 DIAGNOSIS — G894 Chronic pain syndrome: Secondary | ICD-10-CM | POA: Diagnosis not present

## 2018-11-03 DIAGNOSIS — M4716 Other spondylosis with myelopathy, lumbar region: Secondary | ICD-10-CM | POA: Diagnosis not present

## 2018-11-03 DIAGNOSIS — Z79891 Long term (current) use of opiate analgesic: Secondary | ICD-10-CM | POA: Diagnosis not present

## 2018-11-03 DIAGNOSIS — M5416 Radiculopathy, lumbar region: Secondary | ICD-10-CM | POA: Diagnosis not present

## 2018-11-10 ENCOUNTER — Encounter: Payer: Self-pay | Admitting: Physician Assistant

## 2018-11-22 ENCOUNTER — Other Ambulatory Visit: Payer: Self-pay | Admitting: *Deleted

## 2018-11-22 DIAGNOSIS — M1712 Unilateral primary osteoarthritis, left knee: Secondary | ICD-10-CM

## 2018-11-22 MED ORDER — MELOXICAM 15 MG PO TABS: ORAL_TABLET | ORAL | 3 refills | Status: AC

## 2018-12-19 DIAGNOSIS — M4716 Other spondylosis with myelopathy, lumbar region: Secondary | ICD-10-CM | POA: Diagnosis not present

## 2018-12-19 DIAGNOSIS — M791 Myalgia, unspecified site: Secondary | ICD-10-CM | POA: Diagnosis not present

## 2018-12-19 DIAGNOSIS — M5136 Other intervertebral disc degeneration, lumbar region: Secondary | ICD-10-CM | POA: Diagnosis not present

## 2018-12-19 DIAGNOSIS — G894 Chronic pain syndrome: Secondary | ICD-10-CM | POA: Diagnosis not present

## 2018-12-19 DIAGNOSIS — Z79891 Long term (current) use of opiate analgesic: Secondary | ICD-10-CM | POA: Diagnosis not present

## 2018-12-19 DIAGNOSIS — M5416 Radiculopathy, lumbar region: Secondary | ICD-10-CM | POA: Diagnosis not present

## 2019-02-09 DIAGNOSIS — G894 Chronic pain syndrome: Secondary | ICD-10-CM | POA: Diagnosis not present

## 2019-02-09 DIAGNOSIS — M4716 Other spondylosis with myelopathy, lumbar region: Secondary | ICD-10-CM | POA: Diagnosis not present

## 2019-02-09 DIAGNOSIS — M5136 Other intervertebral disc degeneration, lumbar region: Secondary | ICD-10-CM | POA: Diagnosis not present

## 2019-02-09 DIAGNOSIS — Z79891 Long term (current) use of opiate analgesic: Secondary | ICD-10-CM | POA: Diagnosis not present

## 2019-02-09 DIAGNOSIS — M5416 Radiculopathy, lumbar region: Secondary | ICD-10-CM | POA: Diagnosis not present

## 2019-04-17 DIAGNOSIS — M5136 Other intervertebral disc degeneration, lumbar region: Secondary | ICD-10-CM | POA: Diagnosis not present

## 2019-04-17 DIAGNOSIS — G894 Chronic pain syndrome: Secondary | ICD-10-CM | POA: Diagnosis not present

## 2019-04-17 DIAGNOSIS — M4716 Other spondylosis with myelopathy, lumbar region: Secondary | ICD-10-CM | POA: Diagnosis not present

## 2019-04-17 DIAGNOSIS — Z79891 Long term (current) use of opiate analgesic: Secondary | ICD-10-CM | POA: Diagnosis not present

## 2019-04-17 DIAGNOSIS — M5416 Radiculopathy, lumbar region: Secondary | ICD-10-CM | POA: Diagnosis not present

## 2019-05-01 DIAGNOSIS — G894 Chronic pain syndrome: Secondary | ICD-10-CM | POA: Diagnosis not present

## 2019-05-01 DIAGNOSIS — Z79891 Long term (current) use of opiate analgesic: Secondary | ICD-10-CM | POA: Diagnosis not present

## 2019-05-01 DIAGNOSIS — M4716 Other spondylosis with myelopathy, lumbar region: Secondary | ICD-10-CM | POA: Diagnosis not present

## 2019-05-01 DIAGNOSIS — M5416 Radiculopathy, lumbar region: Secondary | ICD-10-CM | POA: Diagnosis not present

## 2019-05-01 DIAGNOSIS — M5136 Other intervertebral disc degeneration, lumbar region: Secondary | ICD-10-CM | POA: Diagnosis not present

## 2019-05-18 DIAGNOSIS — M5417 Radiculopathy, lumbosacral region: Secondary | ICD-10-CM | POA: Diagnosis not present

## 2019-06-20 ENCOUNTER — Other Ambulatory Visit: Payer: Self-pay | Admitting: Physician Assistant

## 2019-06-20 DIAGNOSIS — L509 Urticaria, unspecified: Secondary | ICD-10-CM

## 2019-07-10 DIAGNOSIS — M5136 Other intervertebral disc degeneration, lumbar region: Secondary | ICD-10-CM | POA: Diagnosis not present

## 2019-07-10 DIAGNOSIS — G894 Chronic pain syndrome: Secondary | ICD-10-CM | POA: Diagnosis not present

## 2019-07-10 DIAGNOSIS — M5416 Radiculopathy, lumbar region: Secondary | ICD-10-CM | POA: Diagnosis not present

## 2019-07-10 DIAGNOSIS — M4716 Other spondylosis with myelopathy, lumbar region: Secondary | ICD-10-CM | POA: Diagnosis not present

## 2019-07-10 DIAGNOSIS — Z79891 Long term (current) use of opiate analgesic: Secondary | ICD-10-CM | POA: Diagnosis not present

## 2019-09-06 DIAGNOSIS — Z79891 Long term (current) use of opiate analgesic: Secondary | ICD-10-CM | POA: Diagnosis not present

## 2019-09-06 DIAGNOSIS — M5136 Other intervertebral disc degeneration, lumbar region: Secondary | ICD-10-CM | POA: Diagnosis not present

## 2019-09-06 DIAGNOSIS — M4716 Other spondylosis with myelopathy, lumbar region: Secondary | ICD-10-CM | POA: Diagnosis not present

## 2019-09-06 DIAGNOSIS — M5416 Radiculopathy, lumbar region: Secondary | ICD-10-CM | POA: Diagnosis not present

## 2019-09-06 DIAGNOSIS — G894 Chronic pain syndrome: Secondary | ICD-10-CM | POA: Diagnosis not present

## 2020-08-16 IMAGING — DX RIGHT KNEE - 1-2 VIEW
1 series · 1 of 1 positions shown · non-contrast
Comparison: None.

CLINICAL DATA: Patellofemoral arthritis of left knee.

EXAM:
RIGHT KNEE - 1-2 VIEW

[knee ap]
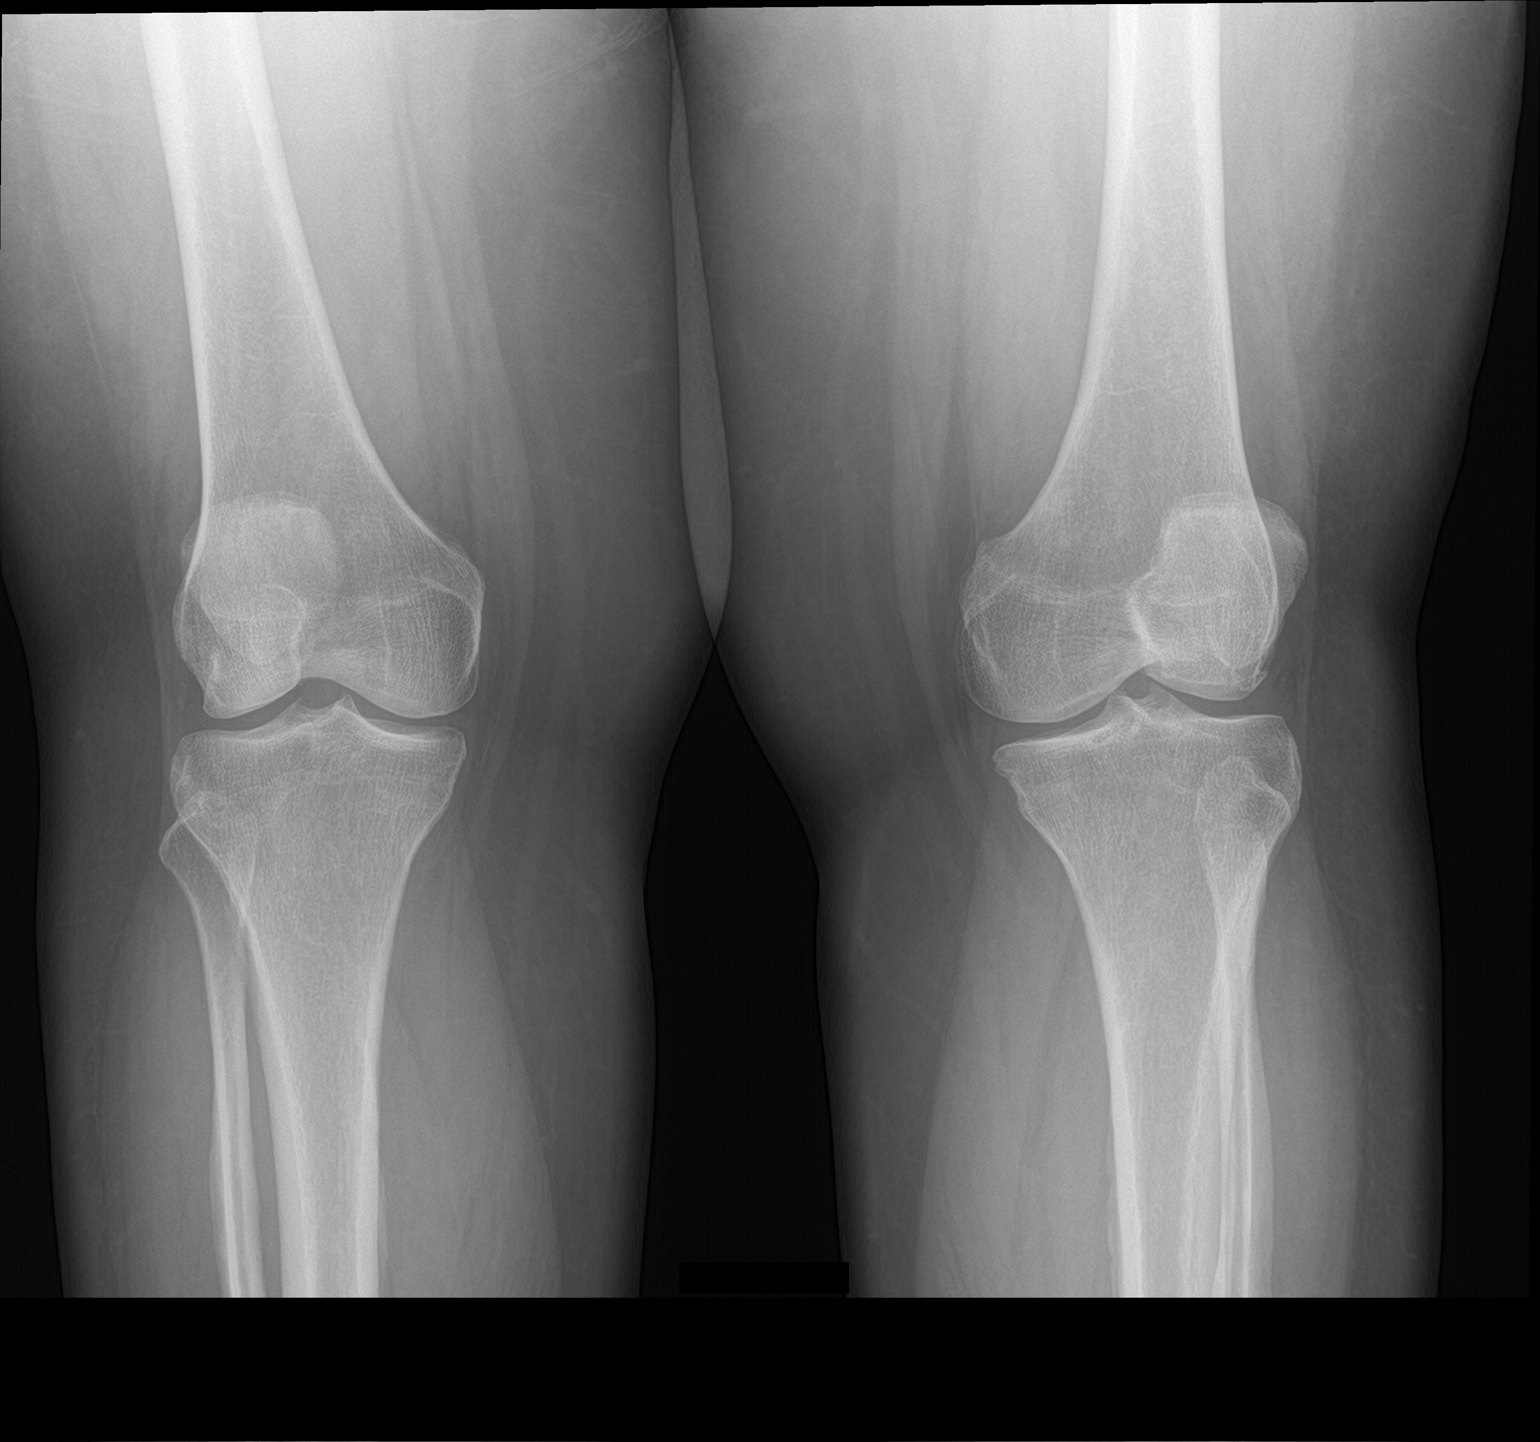

[1 of 1 positions shown; findings below may reference images not displayed]

FINDINGS: No evidence of fracture or dislocation. No evidence of arthropathy
or other focal bone abnormality. Soft tissues are unremarkable.
IMPRESSION: Normal right knee.

## 2020-09-21 IMAGING — MG DIGITAL SCREENING BILATERAL MAMMOGRAM WITH TOMO AND CAD
8 series · 8 of 24 positions shown · non-contrast
Comparison: Previous exam(s).

CLINICAL DATA: Screening.

EXAM:
DIGITAL SCREENING BILATERAL MAMMOGRAM WITH TOMO AND CAD

[L MLO synth-2D]
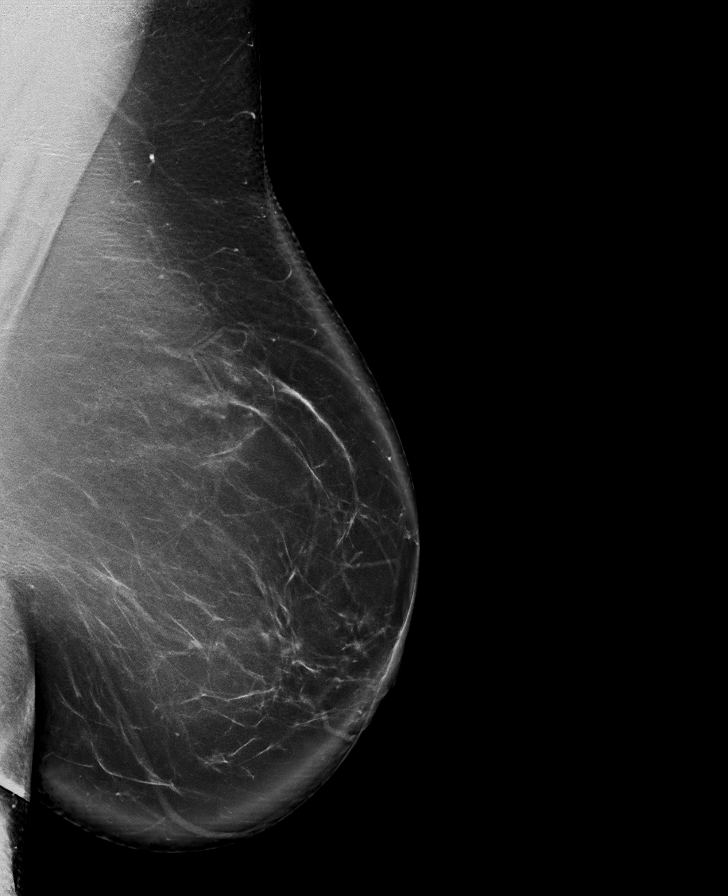

[R CC synth-2D]
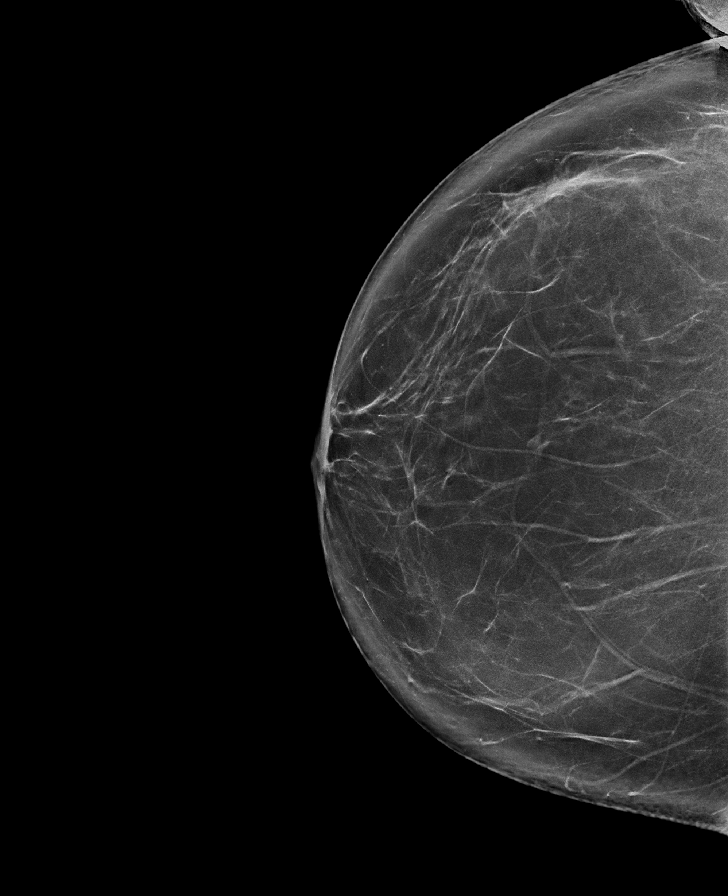

[R MLO synth-2D]
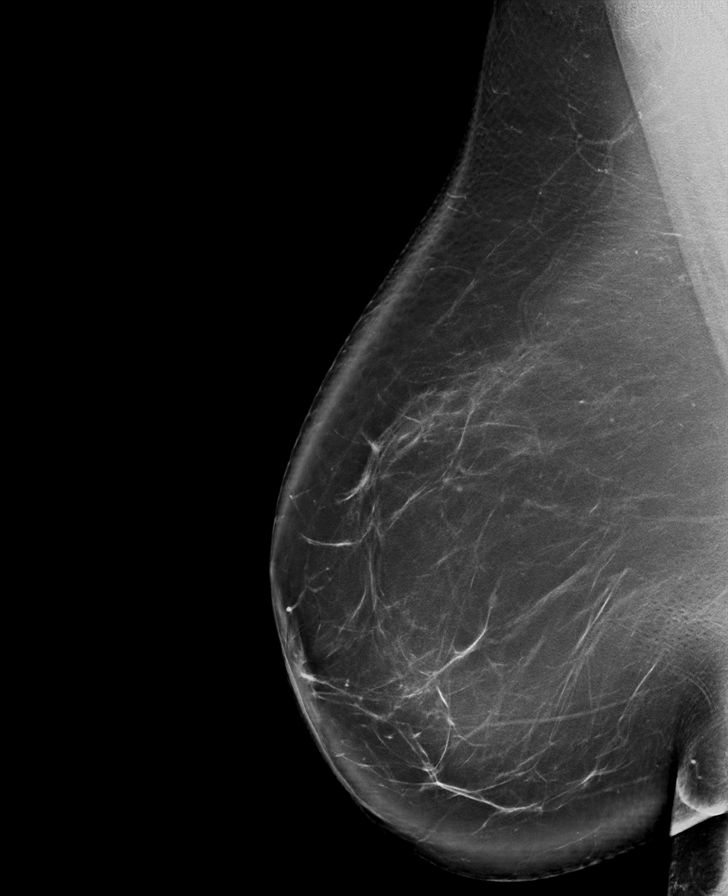

[L CC synth-2D]
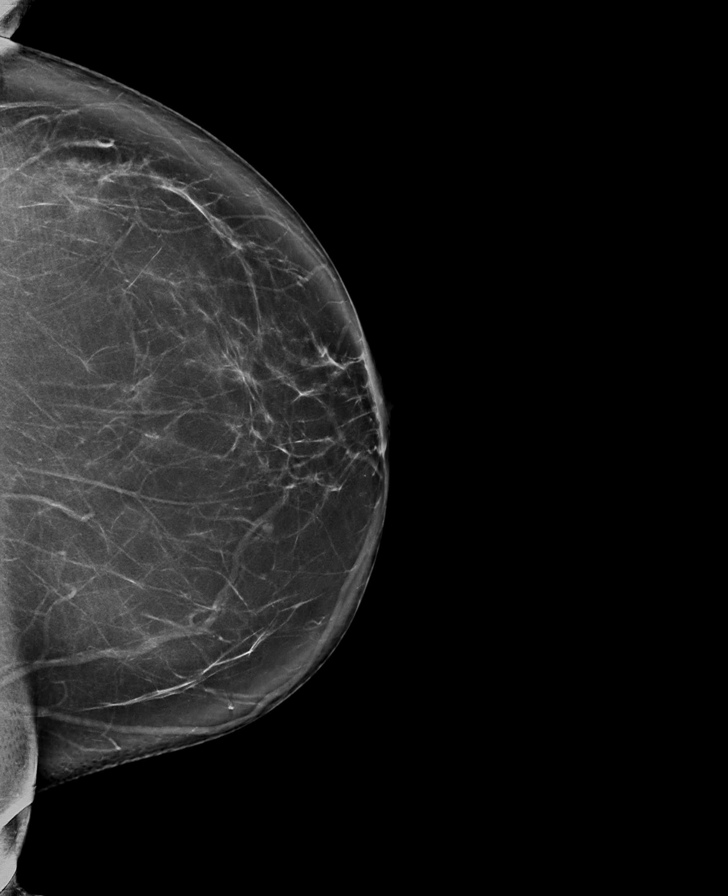

[R MLO tomo · tomo slice 61/120.0]
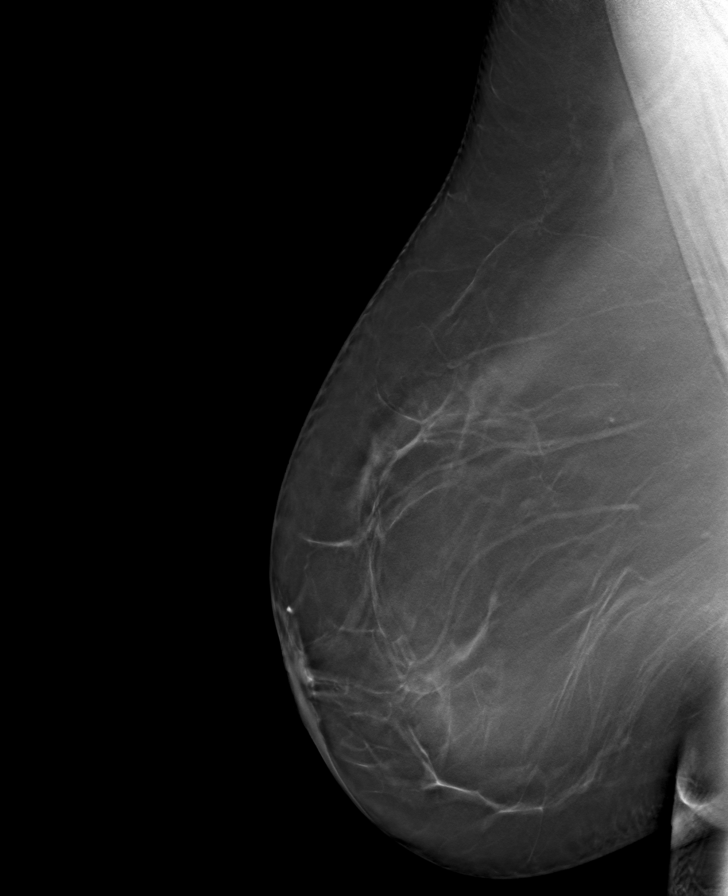

[R CC tomo · tomo slice 46/91.0]
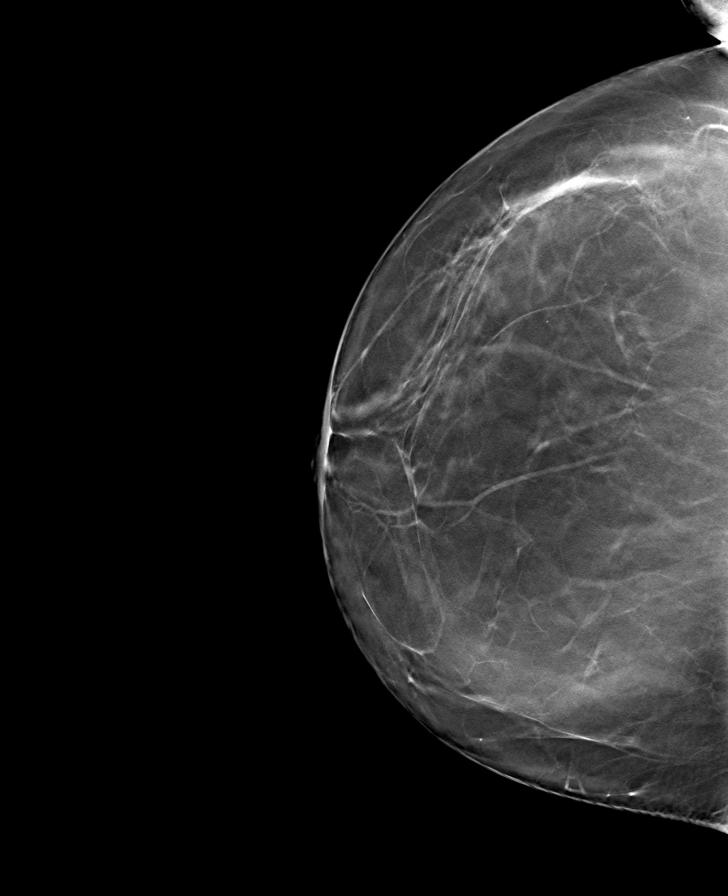

[L CC tomo · tomo slice 46/91.0]
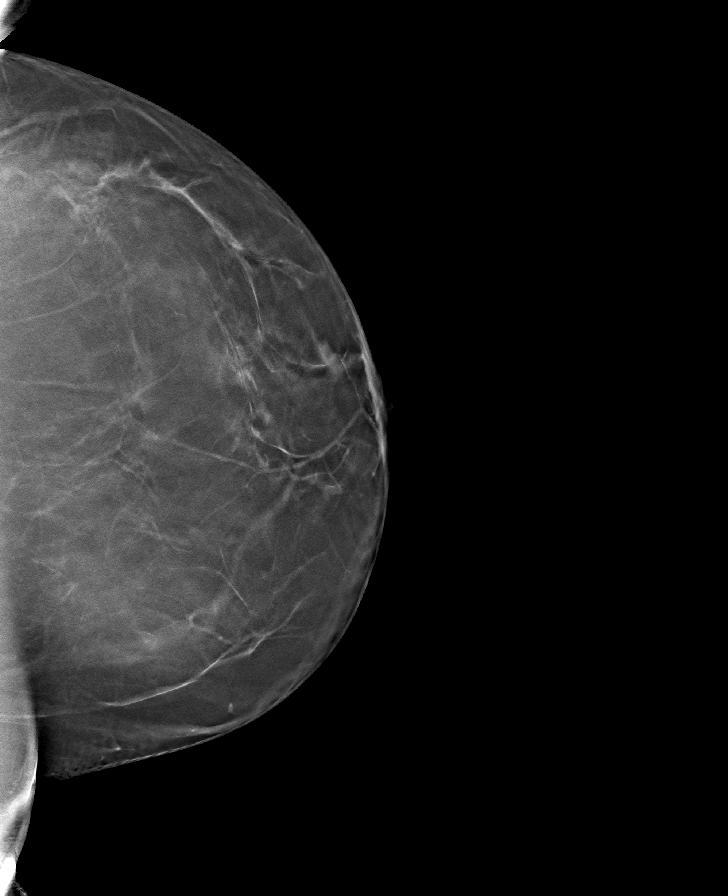

[L MLO tomo · tomo slice 58/115.0]
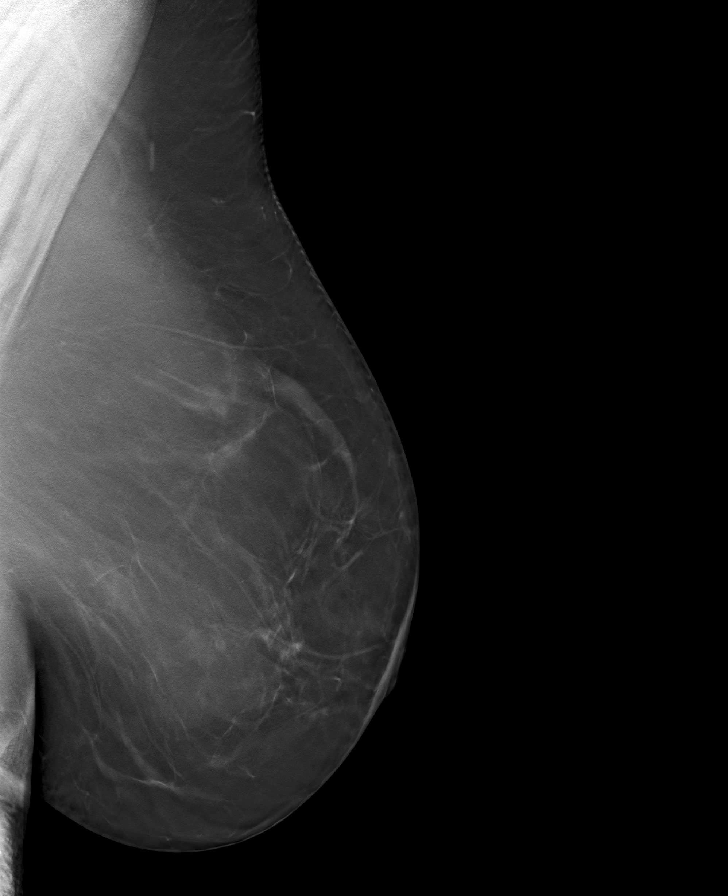

[8 of 24 positions shown; findings below may reference images not displayed]

ACR Breast Density Category b: There are scattered areas of
fibroglandular density.
FINDINGS: There are no findings suspicious for malignancy. Images were
processed with CAD.
IMPRESSION: No mammographic evidence of malignancy. A result letter of this
screening mammogram will be mailed directly to the patient.

RECOMMENDATION:
Screening mammogram in one year. (Code:[id number])

BI-RADS CATEGORY  1: Negative.

## 2021-03-08 ENCOUNTER — Other Ambulatory Visit: Payer: Self-pay | Admitting: Physician Assistant

## 2021-03-08 DIAGNOSIS — J4531 Mild persistent asthma with (acute) exacerbation: Secondary | ICD-10-CM

## 2021-03-08 DIAGNOSIS — L509 Urticaria, unspecified: Secondary | ICD-10-CM

## 2022-07-08 LAB — COLOGUARD: COLOGUARD: NEGATIVE

## 2023-09-21 NOTE — Procedures (Signed)
 SABRA
